# Patient Record
Sex: Female | Born: 1955
Health system: Southern US, Community
[De-identification: ages and names within clinical notes are randomized; demographics above are authoritative.]

## PROBLEM LIST (undated history)

## (undated) DIAGNOSIS — E079 Disorder of thyroid, unspecified: Secondary | ICD-10-CM

## (undated) HISTORY — DX: Other disorders of bilirubin metabolism: E80.6

## (undated) HISTORY — PX: NO PAST SURGERIES: SHX2092

## (undated) HISTORY — DX: Disorder of thyroid, unspecified: E07.9

---

## 2013-07-09 ENCOUNTER — Encounter: Payer: Self-pay | Admitting: Internal Medicine

## 2014-01-02 ENCOUNTER — Ambulatory Visit (INDEPENDENT_AMBULATORY_CARE_PROVIDER_SITE_OTHER): Payer: Managed Care, Other (non HMO) | Admitting: Family Medicine

## 2014-01-02 VITALS — BP 102/70 | HR 84 | Temp 98.4°F | Resp 18 | Ht 66.0 in | Wt 112.0 lb

## 2014-01-02 DIAGNOSIS — E039 Hypothyroidism, unspecified: Secondary | ICD-10-CM

## 2014-01-02 DIAGNOSIS — E038 Other specified hypothyroidism: Secondary | ICD-10-CM | POA: Insufficient documentation

## 2014-01-02 DIAGNOSIS — R350 Frequency of micturition: Secondary | ICD-10-CM

## 2014-01-02 LAB — POCT CBC
GRANULOCYTE PERCENT: 60.6 % (ref 37–80)
HEMATOCRIT: 44.6 % (ref 37.7–47.9)
HEMOGLOBIN: 14.4 g/dL (ref 12.2–16.2)
Lymph, poc: 1.9 (ref 0.6–3.4)
MCH, POC: 30.8 pg (ref 27–31.2)
MCHC: 32.3 g/dL (ref 31.8–35.4)
MCV: 95.5 fL (ref 80–97)
MID (cbc): 0.5 (ref 0–0.9)
MPV: 8.5 fL (ref 0–99.8)
POC Granulocyte: 3.6 (ref 2–6.9)
POC LYMPH PERCENT: 31.5 %L (ref 10–50)
POC MID %: 7.9 %M (ref 0–12)
Platelet Count, POC: 305 10*3/uL (ref 142–424)
RBC: 4.67 M/uL (ref 4.04–5.48)
RDW, POC: 13.3 %
WBC: 5.9 10*3/uL (ref 4.6–10.2)

## 2014-01-02 LAB — COMPREHENSIVE METABOLIC PANEL
ALBUMIN: 4.6 g/dL (ref 3.5–5.2)
ALT: 25 U/L (ref 0–35)
AST: 26 U/L (ref 0–37)
Alkaline Phosphatase: 136 U/L — ABNORMAL HIGH (ref 39–117)
BUN: 17 mg/dL (ref 6–23)
CALCIUM: 9.6 mg/dL (ref 8.4–10.5)
CHLORIDE: 102 meq/L (ref 96–112)
CO2: 30 meq/L (ref 19–32)
Creat: 0.55 mg/dL (ref 0.50–1.10)
GLUCOSE: 93 mg/dL (ref 70–99)
POTASSIUM: 3.9 meq/L (ref 3.5–5.3)
Sodium: 139 mEq/L (ref 135–145)
Total Bilirubin: 1.3 mg/dL — ABNORMAL HIGH (ref 0.3–1.2)
Total Protein: 7.5 g/dL (ref 6.0–8.3)

## 2014-01-02 LAB — POCT UA - MICROSCOPIC ONLY
Amorphous: POSITIVE
Casts, Ur, LPF, POC: NEGATIVE
Crystals, Ur, HPF, POC: NEGATIVE
MUCUS UA: NEGATIVE
RBC, urine, microscopic: NEGATIVE
Renal tubular cells: POSITIVE
Yeast, UA: NEGATIVE

## 2014-01-02 LAB — POCT URINALYSIS DIPSTICK
Bilirubin, UA: NEGATIVE
Blood, UA: NEGATIVE
Glucose, UA: NEGATIVE
KETONES UA: NEGATIVE
Nitrite, UA: NEGATIVE
Protein, UA: NEGATIVE
SPEC GRAV UA: 1.015
Urobilinogen, UA: 0.2
pH, UA: 7.5

## 2014-01-02 NOTE — Patient Instructions (Signed)
I will be in touch with you regarding your labs.  In the meantime just drink plenty of water.  Let me know if you have any problems in the meantime.

## 2014-01-02 NOTE — Progress Notes (Signed)
[facility] [address] [place] [address] [phone]  Date:  01/02/2014   Name:  Renee Watson   DOB:  25-Aug-1956   MRN:  [id number]  PCP:  No primary provider on file.    Chief Complaint: Hypothyroidism and Urinary Tract Infection   History of Present Illness:  Renee Watson is a 58 y.o. very pleasant female patient who presents with the following:  She is here as a new patient today.  She needs her TSH checked for her hypothyroidism.  Her last level was checked about one year ago. However, she now feels like her level might be too low.  She feels that her body is not quite where it should be.  She has not gained weight.  She has not felt very depressed- maybe a little bit blue and anxious lately.  However she just suspects that her level may not be right.   She was dx with hypothyroidism in 2010.   She is a little concerned because her chl went up some at a recent work health screening. She was afraid this might be due to   She also thinks she may have a UTI.  Over thanksgiving she had a UTI.  She used the "tele- doc" service at her work and had a phone consultation.  She was treated with bactrim for 3 days.  She felt better, but her sx never got 100% better.  She tried an OTC UTI diagnostic kit which was positive.  Called the "teledoc" back and was given cipro.  She then felt better- got back to normal. However she now has felt like she might have a UTI again for the last month or so; it may come and go.  She used another azo test strip today and it was "positive and negative."  (tests for leukocytes and nitrites) She has not noted any foul smell or hematuria.   No vaginal sx.   No nausea, vomiting, or fever.  She just feels a "vague uncomfortable feeling" and the need to urinate frequently.  She notes this feeling of discomfort in her lower abdomen.    Patient Active Problem List   Diagnosis Date Noted  . Unspecified hypothyroidism 01/02/2014     Past Medical History  Diagnosis Date  . Thyroid disease     History reviewed. No pertinent past surgical history.  History  Substance Use Topics  . Smoking status: Never Smoker   . Smokeless tobacco: Not on file  . Alcohol Use: Yes    Family History  Problem Relation Age of Onset  . Hyperlipidemia Mother     No Known Allergies  Medication list has been reviewed and updated.  No current outpatient prescriptions on file prior to visit.   No current facility-administered medications on file prior to visit.    Review of Systems:  As per HPI- otherwise negative.   Physical Examination: Filed Vitals:   01/02/14 1428  BP: 102/70  Pulse: 84  Temp: 98.4 F (36.9 C)  Resp: 18   Filed Vitals:   01/02/14 1428  Height: 5' 6"  (1.676 m)  Weight: 112 lb (50.803 kg)   Body mass index is 18.09 kg/(m^2). Ideal Body Weight: Weight in (lb) to have BMI = 25: 154.6  GEN: WDWN, NAD, Non-toxic, A & O x 3, looks well, think build.  Somewhat anxious HEENT: Atraumatic, Normocephalic. Neck supple. No masses, No LAD. Ears and Nose: No external deformity. CV: RRR, No M/G/R. No JVD.  No thrill. No extra heart sounds. PULM: CTA B, no wheezes, crackles, rhonchi. No retractions. No resp. distress. No accessory muscle use. ABD: S, NT, ND, +BS. No rebound. No HSM.  Abdomen is benign EXTR: No c/c/e NEURO Normal gait.  PSYCH: Normally interactive. Conversant.   Results for orders placed in visit on 01/02/14  POCT URINALYSIS DIPSTICK      Result Value Range   Color, UA lt yellow     Clarity, UA clear     Glucose, UA neg     Bilirubin, UA neg     Ketones, UA neg     Spec Grav, UA 1.015     Blood, UA neg     pH, UA 7.5     Protein, UA neg     Urobilinogen, UA 0.2     Nitrite, UA neg     Leukocytes, UA small (1+)    POCT UA - MICROSCOPIC ONLY      Result Value Range   WBC, Ur, HPF, POC 1-4     RBC, urine, microscopic neg     Bacteria, U Microscopic 1+     Mucus, UA neg      Epithelial cells, urine per micros 0-2     Crystals, Ur, HPF, POC neg     Casts, Ur, LPF, POC neg     Yeast, UA neg     Renal tubular cells pos     Amorphous pos    POCT CBC      Result Value Range   WBC 5.9  4.6 - 10.2 K/uL   Lymph, poc 1.9  0.6 - 3.4   POC LYMPH PERCENT 31.5  10 - 50 %L   MID (cbc) 0.5  0 - 0.9   POC MID % 7.9  0 - 12 %M   POC Granulocyte 3.6  2 - 6.9   Granulocyte percent 60.6  37 - 80 %G   RBC 4.67  4.04 - 5.48 M/uL   Hemoglobin 14.4  12.2 - 16.2 g/dL   HCT, POC 44.6  37.7 - 47.9 %   MCV 95.5  80 - 97 fL   MCH, POC 30.8  27 - 31.2 pg   MCHC 32.3  31.8 - 35.4 g/dL   RDW, POC 13.3     Platelet Count, POC 305  142 - 424 K/uL   MPV 8.5  0 - 99.8 fL    Assessment and Plan: Frequent urination - Plan: POCT urinalysis dipstick, POCT UA - Microscopic Only, Urine culture, POCT CBC, Comprehensive metabolic panel  Unspecified hypothyroidism - Plan: TSH  Check TSH today as she is concerned about her thyroid. Will adjust her dosage as needed pending her results.   Vague lower abdominal discomfort. She denies any vaignal dx or dryness.  If urine culture negative will plan on ultrasound of her pelvis.  For now will defer further abx pending her culture results.    Signed [name], MD

## 2014-01-03 LAB — TSH: TSH: 2.094 u[IU]/mL (ref 0.350–4.500)

## 2014-01-03 LAB — URINE CULTURE

## 2014-01-04 MED ORDER — LEVOTHYROXINE SODIUM 50 MCG PO TABS: 50.0000 ug | ORAL_TABLET | Freq: Every day | ORAL | Status: DC

## 2014-01-04 NOTE — Addendum Note (Signed)
Addended by: [name] C on: 01/04/2014 11:16 AM   Modules accepted: Orders

## 2014-01-06 ENCOUNTER — Encounter: Payer: Self-pay | Admitting: Family Medicine

## 2014-01-06 NOTE — Addendum Note (Signed)
Addended by: Jethro Bolus A on: 01/06/2014 11:41 AM   Modules accepted: Orders

## 2014-01-09 ENCOUNTER — Other Ambulatory Visit: Payer: Self-pay | Admitting: Family Medicine

## 2014-01-09 ENCOUNTER — Ambulatory Visit
Admission: RE | Admit: 2014-01-09 | Discharge: 2014-01-09 | Disposition: A | Discharge status: Home / Self Care | Payer: 59 | Source: Ambulatory Visit | Attending: Family Medicine | Admitting: Family Medicine

## 2014-01-09 DIAGNOSIS — R350 Frequency of micturition: Secondary | ICD-10-CM

## 2014-01-16 ENCOUNTER — Encounter: Payer: Self-pay | Admitting: Family Medicine

## 2014-01-16 DIAGNOSIS — R945 Abnormal results of liver function studies: Secondary | ICD-10-CM

## 2014-12-15 ENCOUNTER — Other Ambulatory Visit: Payer: Self-pay | Admitting: Family Medicine

## 2014-12-15 DIAGNOSIS — Z1329 Encounter for screening for other suspected endocrine disorder: Secondary | ICD-10-CM

## 2015-01-08 ENCOUNTER — Other Ambulatory Visit: Payer: Self-pay | Admitting: Family Medicine

## 2015-01-08 ENCOUNTER — Ambulatory Visit (INDEPENDENT_AMBULATORY_CARE_PROVIDER_SITE_OTHER): Payer: 59 | Admitting: Family Medicine

## 2015-01-08 VITALS — BP 112/64 | HR 75 | Temp 98.5°F | Resp 16 | Ht 66.0 in | Wt 111.0 lb

## 2015-01-08 DIAGNOSIS — Z23 Encounter for immunization: Secondary | ICD-10-CM

## 2015-01-08 DIAGNOSIS — R748 Abnormal levels of other serum enzymes: Secondary | ICD-10-CM

## 2015-01-08 DIAGNOSIS — Z780 Asymptomatic menopausal state: Secondary | ICD-10-CM

## 2015-01-08 DIAGNOSIS — R636 Underweight: Secondary | ICD-10-CM

## 2015-01-08 DIAGNOSIS — E038 Other specified hypothyroidism: Secondary | ICD-10-CM

## 2015-01-08 LAB — COMPREHENSIVE METABOLIC PANEL
ALT: 29 U/L (ref 0–35)
AST: 30 U/L (ref 0–37)
Albumin: 4.3 g/dL (ref 3.5–5.2)
Alkaline Phosphatase: 163 U/L — ABNORMAL HIGH (ref 39–117)
BUN: 14 mg/dL (ref 6–23)
CALCIUM: 9.5 mg/dL (ref 8.4–10.5)
CO2: 28 mEq/L (ref 19–32)
Chloride: 103 mEq/L (ref 96–112)
Creat: 0.56 mg/dL (ref 0.50–1.10)
Glucose, Bld: 79 mg/dL (ref 70–99)
Potassium: 4.4 mEq/L (ref 3.5–5.3)
SODIUM: 137 meq/L (ref 135–145)
Total Bilirubin: 1.6 mg/dL — ABNORMAL HIGH (ref 0.2–1.2)
Total Protein: 7.2 g/dL (ref 6.0–8.3)

## 2015-01-08 LAB — TSH: TSH: 1.899 u[IU]/mL (ref 0.350–4.500)

## 2015-01-08 MED ORDER — LEVOTHYROXINE SODIUM 50 MCG PO TABS: 50.0000 ug | ORAL_TABLET | Freq: Every day | ORAL | Status: DC

## 2015-01-08 NOTE — Progress Notes (Addendum)
[facility] [address], [place] [address] [phone]  Date:  01/08/2015   Name:  Renee Watson   DOB:  [birth year]   MRN:  [id number]  PCP:  No primary care provider on file.    Chief Complaint: Follow-up   History of Present Illness:  Renee Watson is a 59 y.o. very pleasant female patient who presents with the following:  Here today to follow-up labs.  Last seen by myself about a year ago.  She needs a follow- up TSH today and also a CMP to follow-up her minimally elevated alk phos and bilirubin in the past She needs a RF of her thyroid medication She would like a vitamin D level.  She has had a bone density scan a while ago- she thinks it was normal but is really not sure how long ago this was.  She is quite slim and does not get a lot of sun exposure due to history of skin cancer She enjoys walking for exercise She would like to have a tetnus shot today- she is overdue but unsure of the date of her last sthot  Patient Active Problem List   Diagnosis Date Noted  . Unspecified hypothyroidism 01/02/2014    Past Medical History  Diagnosis Date  . Thyroid disease     History reviewed. No pertinent past surgical history.  History  Substance Use Topics  . Smoking status: Never Smoker   . Smokeless tobacco: Not on file  . Alcohol Use: Yes    Family History  Problem Relation Age of Onset  . Hyperlipidemia Mother     No Known Allergies  Medication list has been reviewed and updated.  Current Outpatient Prescriptions on File Prior to Visit  Medication Sig Dispense Refill  . levothyroxine (SYNTHROID, LEVOTHROID) 50 MCG tablet Take 1 tablet (50 mcg total) by mouth daily before breakfast. 90 tablet 3   No current facility-administered medications on file prior to visit.    Review of Systems:  As per HPI- otherwise negative.   Physical Examination: Filed Vitals:   01/08/15 1017  BP: 112/64  Pulse: 75  Temp: 98.5 F (36.9 C)   Resp: 16   Filed Vitals:   01/08/15 1017  Height: 5' 6"  (1.676 m)  Weight: 111 lb (50.349 kg)   Body mass index is 17.92 kg/(m^2). Ideal Body Weight: Weight in (lb) to have BMI = 25: 154.6  GEN: WDWN, NAD, Non-toxic, A & O x 3, very slim, looks well HEENT: Atraumatic, Normocephalic. Neck supple. No masses, No LAD. Ears and Nose: No external deformity. CV: RRR, No M/G/R. No JVD. No thrill. No extra heart sounds. PULM: CTA B, no wheezes, crackles, rhonchi. No retractions. No resp. distress. No accessory muscle use. ABD: S, NT, ND. No rebound. No HSM. EXTR: No c/c/e NEURO Normal gait.  PSYCH: Normally interactive. Conversant. Not depressed or anxious appearing.  Calm demeanor.   Wt Readings from Last 3 Encounters:  01/08/15 111 lb (50.349 kg)  01/02/14 112 lb (50.803 kg)   Assessment and Plan: Other specified hypothyroidism - Plan: levothyroxine (SYNTHROID, LEVOTHROID) 50 MCG tablet, TSH  Postmenopausal - Plan: Vitamin D, 25-hydroxy  Abnormal alkaline phosphatase test - Plan: Comprehensive metabolic panel  Immunization due - Plan: Tdap vaccine greater than or equal to 7yo IM  Counseled her that her BMI is underweight,  Encouraged her to gain a few lbs and she will try Check labs as above Update tdap, flu shot done already  Encouraged a mammogram/ bone density but she wants to defer due to recent expenditures   Will plan further follow- up pending labs.   Signed [name], MD  addnd 1/16  Results for orders placed or performed in visit on 01/08/15  Comprehensive metabolic panel  Result Value Ref Range   Sodium 137 135 - 145 mEq/L   Potassium 4.4 3.5 - 5.3 mEq/L   Chloride 103 96 - 112 mEq/L   CO2 28 19 - 32 mEq/L   Glucose, Bld 79 70 - 99 mg/dL   BUN 14 6 - 23 mg/dL   Creat 0.56 0.50 - 1.10 mg/dL   Total Bilirubin 1.6 (H) 0.2 - 1.2 mg/dL   Alkaline Phosphatase 163 (H) 39 - 117 U/L   AST 30 0 - 37 U/L   ALT 29 0 - 35 U/L   Total Protein 7.2 6.0 - 8.3 g/dL    Albumin 4.3 3.5 - 5.2 g/dL   Calcium 9.5 8.4 - 10.5 mg/dL  TSH  Result Value Ref Range   TSH 1.899 0.350 - 4.500 uIU/mL  Vitamin D, 25-hydroxy  Result Value Ref Range   Vit D, 25-Hydroxy 54 30 - 100 ng/mL   Alk phos and also GGT, bili elevated.  Need to do further evaluation into origin of likely cholestasis.  Will order RUQ [place] and proceed pending results

## 2015-01-08 NOTE — Patient Instructions (Addendum)
I will be in touch with your labs- I'll send your TSH level to you on mychart.  Please wait to fill your thyroid medication until I send this to you Work on gaining a little weight- may want to add in a couple of snacks daily.   Please think about getting a mammogram and bone density scan sometime this year.    You got your tdap shot today (tetanus plus pertussis booster)

## 2015-01-09 LAB — HEPATITIS PANEL, ACUTE
HCV Ab: NEGATIVE
HEP B S AG: NEGATIVE
Hep A IgM: NONREACTIVE
Hep B C IgM: NONREACTIVE

## 2015-01-09 LAB — BILIRUBIN,DIRECT & INDIRECT (FRACTIONATED)
Bilirubin, Direct: 0.3 mg/dL (ref 0.0–0.3)
Indirect Bilirubin: 1.3 mg/dL — ABNORMAL HIGH (ref 0.2–1.2)

## 2015-01-09 LAB — GAMMA GT: GGT: 204 U/L — ABNORMAL HIGH (ref 7–51)

## 2015-01-09 LAB — VITAMIN D 25 HYDROXY (VIT D DEFICIENCY, FRACTURES): Vit D, 25-Hydroxy: 54 ng/mL (ref 30–100)

## 2015-01-10 ENCOUNTER — Encounter: Payer: Self-pay | Admitting: Family Medicine

## 2015-01-10 DIAGNOSIS — R636 Underweight: Secondary | ICD-10-CM | POA: Insufficient documentation

## 2015-01-10 NOTE — Addendum Note (Signed)
Addended by: [name] C on: 01/10/2015 07:05 AM   Modules accepted: Orders

## 2015-01-15 ENCOUNTER — Ambulatory Visit
Admission: RE | Admit: 2015-01-15 | Discharge: 2015-01-15 | Disposition: A | Discharge status: Home / Self Care | Payer: 59 | Source: Ambulatory Visit | Attending: Family Medicine | Admitting: Family Medicine

## 2015-01-15 ENCOUNTER — Other Ambulatory Visit: Payer: Self-pay | Admitting: Family Medicine

## 2015-01-15 DIAGNOSIS — R748 Abnormal levels of other serum enzymes: Secondary | ICD-10-CM

## 2015-01-16 ENCOUNTER — Other Ambulatory Visit: Payer: 59

## 2015-01-16 ENCOUNTER — Encounter: Payer: Self-pay | Admitting: Family Medicine

## 2015-01-19 ENCOUNTER — Encounter: Payer: Self-pay | Admitting: Internal Medicine

## 2015-02-04 ENCOUNTER — Other Ambulatory Visit (INDEPENDENT_AMBULATORY_CARE_PROVIDER_SITE_OTHER): Payer: 59

## 2015-02-04 ENCOUNTER — Encounter: Payer: Self-pay | Admitting: Internal Medicine

## 2015-02-04 ENCOUNTER — Ambulatory Visit (INDEPENDENT_AMBULATORY_CARE_PROVIDER_SITE_OTHER): Payer: 59 | Admitting: Internal Medicine

## 2015-02-04 VITALS — BP 102/68 | HR 92 | Ht 66.0 in | Wt 109.0 lb

## 2015-02-04 DIAGNOSIS — R17 Unspecified jaundice: Secondary | ICD-10-CM

## 2015-02-04 DIAGNOSIS — R748 Abnormal levels of other serum enzymes: Secondary | ICD-10-CM

## 2015-02-04 DIAGNOSIS — Z1211 Encounter for screening for malignant neoplasm of colon: Secondary | ICD-10-CM

## 2015-02-04 LAB — CBC WITH DIFFERENTIAL/PLATELET
BASOS ABS: 0 10*3/uL (ref 0.0–0.1)
Basophils Relative: 0.8 % (ref 0.0–3.0)
EOS ABS: 0.1 10*3/uL (ref 0.0–0.7)
Eosinophils Relative: 1.7 % (ref 0.0–5.0)
HEMATOCRIT: 42.3 % (ref 36.0–46.0)
Hemoglobin: 14.6 g/dL (ref 12.0–15.0)
LYMPHS PCT: 26.1 % (ref 12.0–46.0)
Lymphs Abs: 1.3 10*3/uL (ref 0.7–4.0)
MCHC: 34.5 g/dL (ref 30.0–36.0)
MCV: 89 fl (ref 78.0–100.0)
Monocytes Absolute: 0.5 10*3/uL (ref 0.1–1.0)
Monocytes Relative: 9 % (ref 3.0–12.0)
NEUTROS ABS: 3.2 10*3/uL (ref 1.4–7.7)
Neutrophils Relative %: 62.4 % (ref 43.0–77.0)
Platelets: 276 10*3/uL (ref 150.0–400.0)
RBC: 4.76 Mil/uL (ref 3.87–5.11)
RDW: 12.7 % (ref 11.5–15.5)
WBC: 5.1 10*3/uL (ref 4.0–10.5)

## 2015-02-04 LAB — PROTIME-INR
INR: 1 ratio (ref 0.8–1.0)
Prothrombin Time: 11.5 s (ref 9.6–13.1)

## 2015-02-04 NOTE — Patient Instructions (Signed)
Your physician has requested that you go to the basement for lab work before leaving today  You have been given some information on colonoscopies  Please follow up with Dr. Henrene Pastor on 03/03/2015 at 1:30pm.

## 2015-02-04 NOTE — Progress Notes (Signed)
HISTORY OF PRESENT ILLNESS:  Renee Watson is a 59 y.o. female with a history of hypothyroidism who is referred today regarding persistent elevation of alkaline phosphatase and bilirubin. The patient denies any personal history of liver disease. Her father apparently had alcoholic cirrhosis. The patient herself has limited alcohol use (1-2 glasses of wine on weekends only). Last year she was noted to have mild elevation of alkaline phosphatase and bilirubin. One month ago the same with alkaline phosphatase 163 and total bilirubin 1.6. The majority of the bilirubin elevation was in direct equaling 1.3. The transaminases were normal. Mild elevation of GGT. Normal vitamin D level and TSH. Patient has been reading about different liver conditions online and is quite concerned/anxious. She denies fatigue or itching. Her weight has been stable. An abdominal ultrasound was performed 01/15/2015. The liver was normal. She has not had screening colonoscopy, though reports negative Hemoccult studies.  REVIEW OF SYSTEMS:  All non-GI ROS negative upon comprehensive review  Past Medical History  Diagnosis Date  . Thyroid disease   . Hyperbilirubinemia     History reviewed. No pertinent past surgical history.  Social History Renee Watson  reports that she has never smoked. She has never used smokeless tobacco. She reports that she drinks alcohol. She reports that she does not use illicit drugs.  family history includes Diabetes in her mother; Hyperlipidemia in her mother.  No Known Allergies     PHYSICAL EXAMINATION: Vital signs: BP 102/68 mmHg  Pulse 92  Ht 5\' 6"  (1.676 m)  Wt 109 lb (49.442 kg)  BMI 17.60 kg/m2  Constitutional: generally well-appearing, no acute distress Psychiatric: alert and oriented x3, cooperative Eyes: extraocular movements intact, anicteric, conjunctiva pink Mouth: oral pharynx moist, no lesions Neck: supple no lymphadenopathy Cardiovascular: heart regular rate  and rhythm, no murmur Lungs: clear to auscultation bilaterally Abdomen: soft, nontender, nondistended, no obvious ascites, no peritoneal signs, normal bowel sounds, no organomegaly Rectal: Omitted Extremities: no lower extremity edema bilaterally Skin: no lesions on visible extremities Neuro: No focal deficits. No asterixis.    ASSESSMENT:  #1. Elevation of alkaline phosphatase. Asymptomatic without obvious evidence for liver disease. May represent primary biliary cirrhosis #2. Elevated bilirubin. Principally indirect. Suspect Gilbert's syndrome #3. Colon cancer screening. Baseline risk. We discussed screening colonoscopy   PLAN:  #1. CBC, PT/INR, antimitochondrial antibody, fractionated alkaline phosphatase #2. Literature on colon cancer and colon polyps as well as colonoscopy provided for patient review. Discuss at next visit #3. Office visit in a few weeks.

## 2015-02-06 ENCOUNTER — Telehealth: Payer: Self-pay | Admitting: Internal Medicine

## 2015-02-06 NOTE — Telephone Encounter (Signed)
Patient notified that part of labs are still pending.

## 2015-02-09 LAB — ALKALINE PHOSPHATASE ISOENZYMES
Alkaline Phonsphatase: 231 U/L — ABNORMAL HIGH (ref 33–130)
Bone Isoenzymes: 9 % — ABNORMAL LOW (ref 28–66)
Intestinal Isoenzymes: 0 % — ABNORMAL LOW (ref 1–24)
LIVER ISOENZYMES (ALP ISO): 70 % — AB (ref 25–69)
MACROHEPATIC ISOENZYMES: 21 % — AB

## 2015-02-13 ENCOUNTER — Telehealth: Payer: Self-pay | Admitting: Internal Medicine

## 2015-02-17 ENCOUNTER — Other Ambulatory Visit: Payer: 59

## 2015-02-17 ENCOUNTER — Other Ambulatory Visit: Payer: Self-pay

## 2015-02-17 DIAGNOSIS — R7989 Other specified abnormal findings of blood chemistry: Secondary | ICD-10-CM

## 2015-02-17 DIAGNOSIS — R945 Abnormal results of liver function studies: Principal | ICD-10-CM

## 2015-02-17 NOTE — Telephone Encounter (Signed)
Discussed with pt that the antimitochondrial antibodies needs to be redrawn. Pt will come to have redrawn.

## 2015-02-18 LAB — MITOCHONDRIAL ANTIBODIES: Mitochondrial M2 Ab, IgG: 0.33 (ref <0.91)

## 2015-02-25 ENCOUNTER — Other Ambulatory Visit: Payer: Self-pay

## 2015-02-25 DIAGNOSIS — R748 Abnormal levels of other serum enzymes: Secondary | ICD-10-CM

## 2015-02-26 ENCOUNTER — Telehealth: Payer: Self-pay | Admitting: Internal Medicine

## 2015-02-26 NOTE — Telephone Encounter (Signed)
Spoke with pt and she wanted more information as to why the MRI/MRCP was being done. Discussed this with pt and questions answered.

## 2015-02-27 ENCOUNTER — Ambulatory Visit (HOSPITAL_COMMUNITY)
Admission: RE | Admit: 2015-02-27 | Discharge: 2015-02-27 | Disposition: A | Discharge status: Home / Self Care | Payer: 59 | Source: Ambulatory Visit | Attending: Internal Medicine | Admitting: Internal Medicine

## 2015-02-27 ENCOUNTER — Other Ambulatory Visit: Payer: Self-pay | Admitting: Internal Medicine

## 2015-02-27 DIAGNOSIS — R748 Abnormal levels of other serum enzymes: Secondary | ICD-10-CM | POA: Diagnosis not present

## 2015-02-27 DIAGNOSIS — K769 Liver disease, unspecified: Secondary | ICD-10-CM | POA: Insufficient documentation

## 2015-02-27 MED ORDER — GADOBENATE DIMEGLUMINE 529 MG/ML IV SOLN
10.0000 mL | Freq: Once | INTRAVENOUS | Status: AC | PRN
Start: 2015-02-27 — End: 2015-02-27
  Administered 2015-02-27: 10 mL via INTRAVENOUS

## 2015-03-03 ENCOUNTER — Encounter: Payer: Self-pay | Admitting: Family Medicine

## 2015-03-03 ENCOUNTER — Ambulatory Visit (INDEPENDENT_AMBULATORY_CARE_PROVIDER_SITE_OTHER): Payer: 59 | Admitting: Internal Medicine

## 2015-03-03 ENCOUNTER — Encounter: Payer: Self-pay | Admitting: Internal Medicine

## 2015-03-03 VITALS — BP 100/64 | HR 60 | Ht 66.0 in | Wt 110.6 lb

## 2015-03-03 DIAGNOSIS — R748 Abnormal levels of other serum enzymes: Secondary | ICD-10-CM | POA: Insufficient documentation

## 2015-03-03 DIAGNOSIS — R945 Abnormal results of liver function studies: Secondary | ICD-10-CM

## 2015-03-03 DIAGNOSIS — R17 Unspecified jaundice: Secondary | ICD-10-CM

## 2015-03-03 DIAGNOSIS — Z1211 Encounter for screening for malignant neoplasm of colon: Secondary | ICD-10-CM

## 2015-03-03 DIAGNOSIS — R7989 Other specified abnormal findings of blood chemistry: Secondary | ICD-10-CM

## 2015-03-03 NOTE — Progress Notes (Signed)
HISTORY OF PRESENT ILLNESS:  Renee Watson is a 59 y.o. female who was evaluated 02/04/2015 for mild elevation of alkaline phosphatase. Also, mild elevation of bilirubin, mostly unconjugated consistent with Gilbert's. She was asymptomatic without obvious liver disease. Prior abdominal ultrasound was negative. Alkaline phosphatase was fractionated and consistent with liver origin. CBC with differential was normal as was TSH. Prothrombin time was normal and AMA negative. She was on no medications or supplements other than levothyroid. Healthy without other medical conditions. Subsequently sent for MRI/MRCP. No evidence for infiltrating or space-occupying liver lesions. Small incidental FNH and hemangioma noted. Normal biliary system without changes of sclerosing cholangitis. She presents today for follow-up. Other than being anxious regarding the possible clinical implications of her lab test abnormality, no other issues. I did introduce topic of screening colonoscopy at the time of her last visit  REVIEW OF SYSTEMS:  All non-GI ROS negative upon review  Past Medical History  Diagnosis Date  . Thyroid disease   . Hyperbilirubinemia     History reviewed. No pertinent past surgical history.  Social History Renee Watson  reports that she has never smoked. She has never used smokeless tobacco. She reports that she drinks alcohol. She reports that she does not use illicit drugs.  family history includes Diabetes in her mother; Hyperlipidemia in her mother.  No Known Allergies     PHYSICAL EXAMINATION: Vital signs: BP 100/64 mmHg  Pulse 60  Ht 5\' 6"  (1.676 m)  Wt 110 lb 9.6 oz (50.168 kg)  BMI 17.86 kg/m2 General: Well-developed, well-nourished, no acute distress HEENT: Sclerae are anicteric,  Abdomen: Not reexamined Psychiatric: alert and oriented x3. Cooperative   ASSESSMENT:  #1. Mild elevation of alkaline phosphatase (approximately 1.5 times the upper limit normal). No  obvious etiology despite extensive workup. Asymptomatic. Normal liver function. No confounding variables.  #2. Mild elevation in indirect bilirubin. Suspect Gilbert's #3. Colon cancer screening. Discussed   PLAN:  #1. At this point we discussed further workup, which would be liver biopsy. Discussed rare entities such as vanishing bile duct syndrome. Also discussed observation with close follow-up. She opted for the latter, which I support. #2. Again discussed colon cancer screening and screening colonoscopy. She has reviewed the information and will consider such #3. Routine GI follow-up January 2017. Repeat liver tests at that time. #4. Resume general medical care with Dr. Lorelei Pont   Approximately 50 minutes was spent with this patient reviewing her condition, its workup, and management

## 2015-03-03 NOTE — Patient Instructions (Signed)
Please follow up with Dr. Henrene Pastor in January

## 2015-09-24 ENCOUNTER — Telehealth: Payer: Self-pay | Admitting: Family Medicine

## 2015-09-24 NOTE — Telephone Encounter (Signed)
Left a message for patient to return call to schedule a mammogram. 

## 2015-09-29 ENCOUNTER — Telehealth: Payer: Self-pay | Admitting: Family Medicine

## 2015-09-29 NOTE — Telephone Encounter (Signed)
Left a message for patient to return call to schedule a mammogram. 

## 2016-01-12 ENCOUNTER — Ambulatory Visit (INDEPENDENT_AMBULATORY_CARE_PROVIDER_SITE_OTHER): Payer: 59 | Admitting: Family Medicine

## 2016-01-12 VITALS — BP 112/70 | HR 82 | Temp 98.2°F | Resp 18 | Ht 66.0 in | Wt 111.2 lb

## 2016-01-12 DIAGNOSIS — E038 Other specified hypothyroidism: Secondary | ICD-10-CM | POA: Diagnosis not present

## 2016-01-12 DIAGNOSIS — F4322 Adjustment disorder with anxiety: Secondary | ICD-10-CM

## 2016-01-12 LAB — COMPLETE METABOLIC PANEL WITH GFR
ALT: 25 U/L (ref 6–29)
AST: 24 U/L (ref 10–35)
Albumin: 4.2 g/dL (ref 3.6–5.1)
Alkaline Phosphatase: 194 U/L — ABNORMAL HIGH (ref 33–130)
BUN: 16 mg/dL (ref 7–25)
CO2: 28 mmol/L (ref 20–31)
Calcium: 9.4 mg/dL (ref 8.6–10.4)
Chloride: 102 mmol/L (ref 98–110)
Creat: 0.64 mg/dL (ref 0.50–0.99)
GFR, Est African American: >89 mL/min (ref >=60)
GFR, Est Non African American: >89 mL/min (ref >=60)
Glucose, Bld: 79 mg/dL (ref 65–99)
Potassium: 4.2 mmol/L (ref 3.5–5.3)
Sodium: 141 mmol/L (ref 135–146)
Total Bilirubin: 0.9 mg/dL (ref 0.2–1.2)
Total Protein: 6.6 g/dL (ref 6.1–8.1)

## 2016-01-12 MED ORDER — CITALOPRAM HYDROBROMIDE 10 MG PO TABS: 10.0000 mg | ORAL_TABLET | Freq: Every day | ORAL | Status: DC

## 2016-01-12 MED ORDER — LEVOTHYROXINE SODIUM 50 MCG PO TABS: 50.0000 ug | ORAL_TABLET | Freq: Every day | ORAL | Status: DC

## 2016-01-12 NOTE — Progress Notes (Signed)
Subjective:  This chart was scribed for Robyn Haber MD, by Tamsen Roers, at [facility].  This patient was seen in room 12 and the patient's care was started at 1:36 PM.   Chief Complaint  Patient presents with  . Medication Refill    synthroid 52mcg     Patient ID: Renee Watson, female    DOB: 1956-09-13, 60 y.o.   MRN: [id number]  HPI  HPI Comments: Renee Watson is a 60 y.o. female who presents to the [facility] for a refill for her thyroid medication and blood work.  She is compliant with her thyroid medication and does not eat dairy when she takes it.  She denies any new symptoms but feels like she may be depressed at times. She occasionally feels very anxious/nervous and states that she is very busy at work.  She thinks that her feelings of depression may be due to "getting old" or could be simply due to being run down from a recent cold.  She also states that her mind races at night before bed.  On new years eve, she caught a cold and states that she is just getting over it.   Patient works for an [employer] resources [employer].   Patient Active Problem List   Diagnosis Date Noted  . Elevated alkaline phosphatase level 03/03/2015  . Underweight 01/10/2015  . Unspecified hypothyroidism 01/02/2014   Past Medical History  Diagnosis Date  . Thyroid disease   . Hyperbilirubinemia    History reviewed. No pertinent past surgical history. No Known Allergies Prior to Admission medications   Medication Sig Start Date End Date Taking? Authorizing Provider  levothyroxine (SYNTHROID, LEVOTHROID) 50 MCG tablet Take 1 tablet (50 mcg total) by mouth daily before breakfast. 01/08/15  Yes Darreld Mclean, MD   Social History   Social History  . Marital Status: Divorced    Spouse Name: N/A  . Number of Children: N/A  . Years of Education: N/A   Occupational History  . Not on file.   Social History Main Topics  . Smoking status:  Never Smoker   . Smokeless tobacco: Never Used  . Alcohol Use: 0.0 oz/week    0 Standard drinks or equivalent per week  . Drug Use: No  . Sexual Activity: Not on file   Other Topics Concern  . Not on file   Social History Narrative        Review of Systems  Constitutional: Negative for fever and chills.  Respiratory: Negative for cough and shortness of breath.   Gastrointestinal: Negative for nausea and vomiting.  Musculoskeletal: Negative for neck pain and neck stiffness.  Skin: Negative for color change.  Neurological: Negative for seizures, syncope and speech difficulty.  Psychiatric/Behavioral: The patient is nervous/anxious.        Objective:   Physical Exam  Constitutional: She is oriented to person, place, and time. She appears well-developed and well-nourished. No distress.  HENT:  Head: Normocephalic and atraumatic.  Eyes: Pupils are equal, round, and reactive to light.  Neck: Normal range of motion.  Pulmonary/Chest: Effort normal. No respiratory distress.  Neurological: She is alert and oriented to person, place, and time.  Skin: Skin is warm and dry.   Filed Vitals:   01/12/16 1327  BP: 112/70  Pulse: 82  Temp: 98.2 F (36.8 C)  TempSrc: Oral  Resp: 18  Height: 5\' 6"  (1.676 m)  Weight: 111 lb 3.2 oz (50.44 kg)  SpO2: 98%    patient appears to be somewhat nervous she describes her feelings of mild depression having turned 59 this year.       Assessment & Plan:   This chart was scribed in my presence and reviewed by me personally.    ICD-9-CM ICD-10-CM   1. Other specified hypothyroidism 244.8 E03.8 levothyroxine (SYNTHROID, LEVOTHROID) 50 MCG tablet     Thyroid Panel With TSH  2. Adjustment disorder with anxious mood 309.24 [id number] COMPLETE METABOLIC PANEL WITH GFR     citalopram (CELEXA) 10 MG tablet     Signed, Robyn Haber, MD

## 2016-01-13 LAB — THYROID PANEL WITH TSH
Free Thyroxine Index: 3.1 (ref 1.4–3.8)
T3 Uptake: 35 % (ref 22–35)
T4, Total: 8.8 ug/dL (ref 4.5–12.0)
TSH: 2.364 u[IU]/mL (ref 0.350–4.500)

## 2016-01-15 ENCOUNTER — Encounter: Payer: Self-pay | Admitting: Family Medicine

## 2016-01-20 ENCOUNTER — Encounter: Payer: Self-pay | Admitting: Family Medicine

## 2016-05-06 ENCOUNTER — Telehealth: Payer: Self-pay

## 2016-05-06 NOTE — Telephone Encounter (Signed)
MRI entered in error, pt had scan done and no other radiological exam recommended. Pt did not come for OV in Jan. Letter mailed to pt regarding scheduling an appt.

## 2016-05-06 NOTE — Telephone Encounter (Signed)
-----   Message from Irene Shipper, MD sent at 05/02/2016  4:52 PM EDT ----- Patient overdue for follow up MRI and OV. If she is interested please arrange; if not interested please document for the record and release this overdue result. Thanks  ----- Message -----    From: SYSTEM    Sent: 05/01/2016  12:07 AM      To: Irene Shipper, MD

## 2016-09-04 IMAGING — US US ABDOMEN LIMITED
1 series · 14 of 25 positions shown · non-contrast
Comparison: None.

CLINICAL DATA: Elevated alkaline phosphatase and bilirubin

EXAM:
US ABDOMEN LIMITED - RIGHT UPPER QUADRANT

[Series 1: us abdomen limited · 0.18mm/px · 14 of 47 slices shown]
[im 1/47]
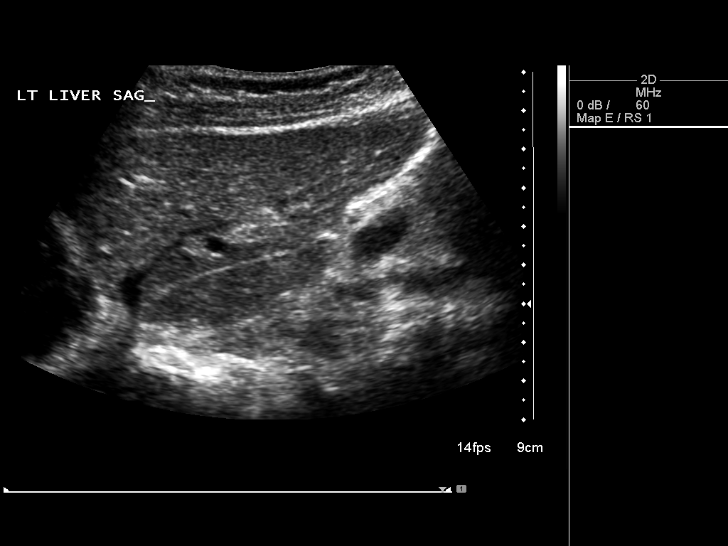
[im 4/47]
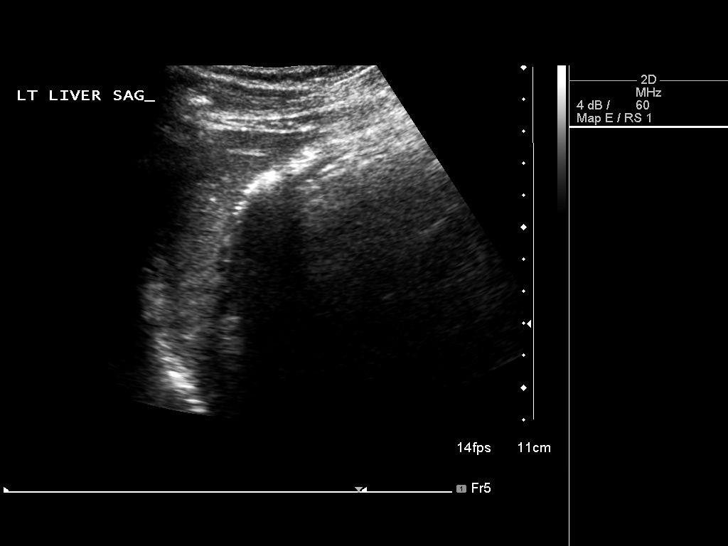
[im 8/47]
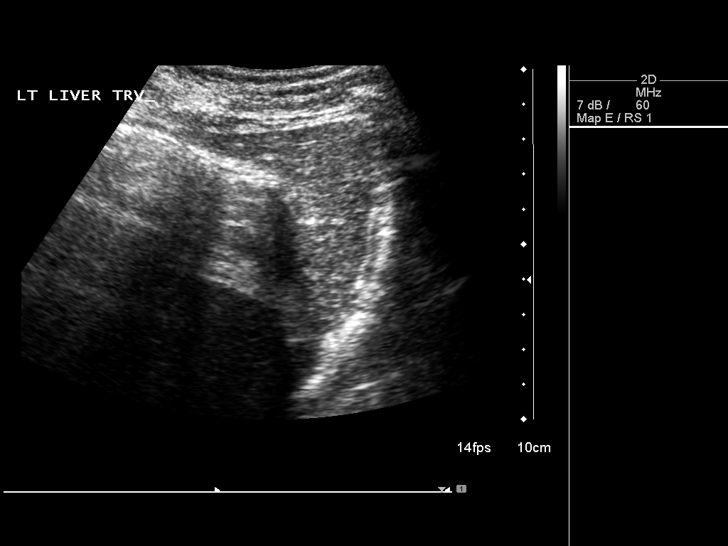
[im 12/47]
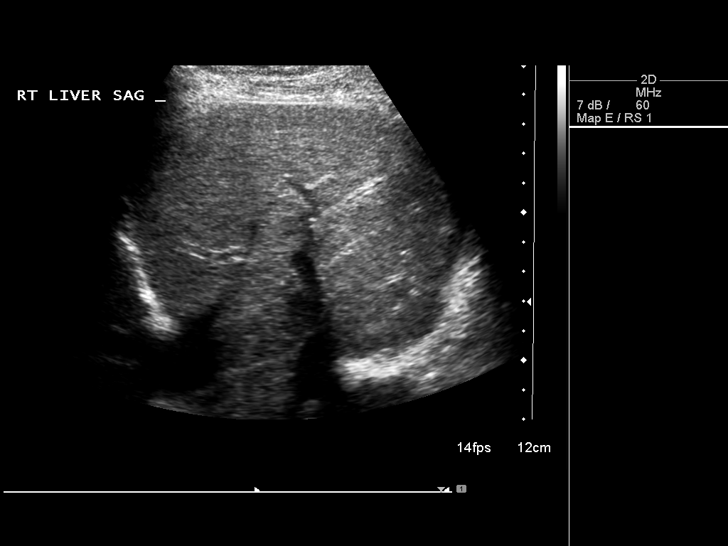
[im 16/47]
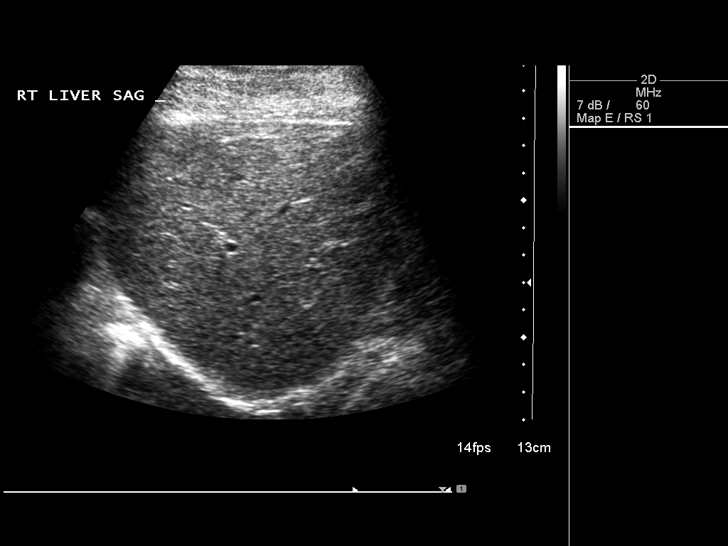
[im 18/47]
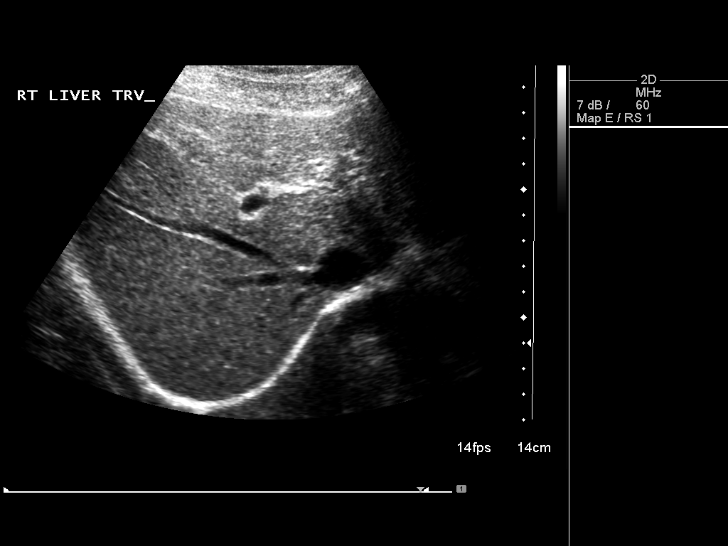
[im 22/47]
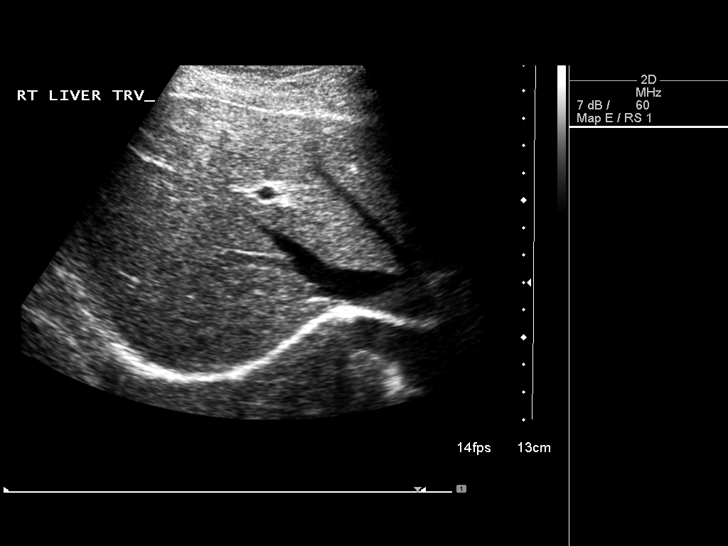
[im 25/47]
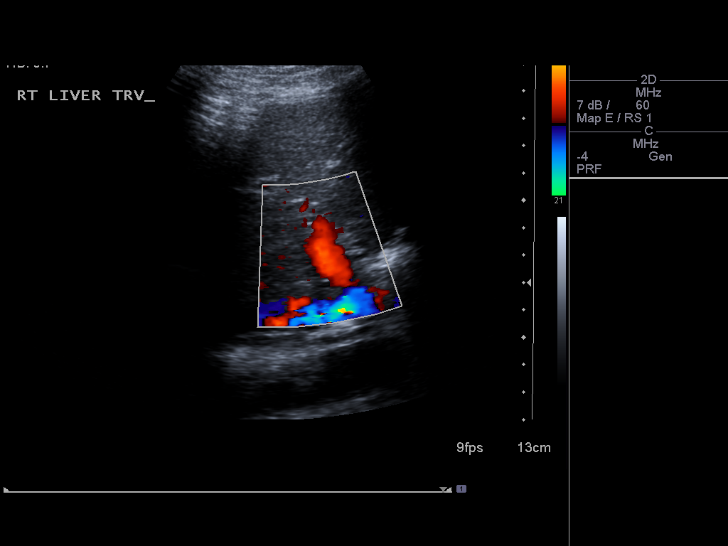
[im 29/47]
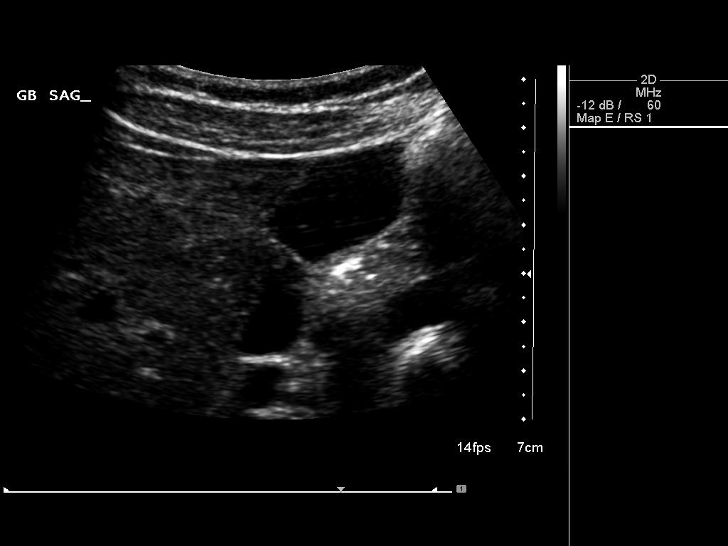
[im 31/47]
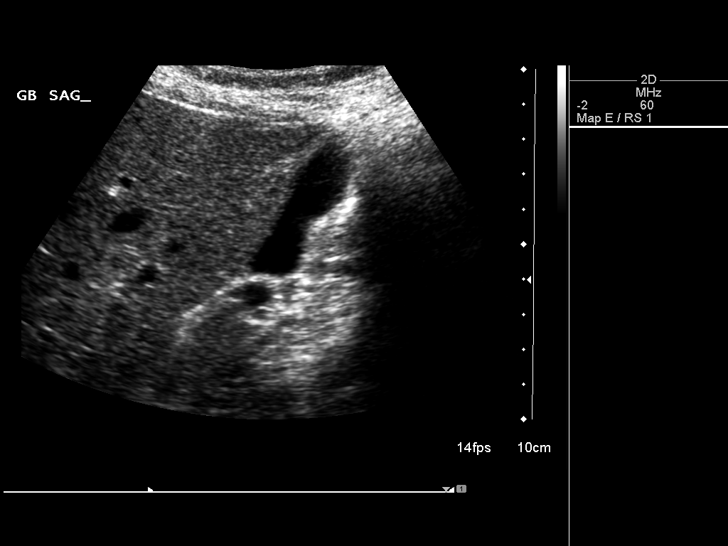
[im 35/47]
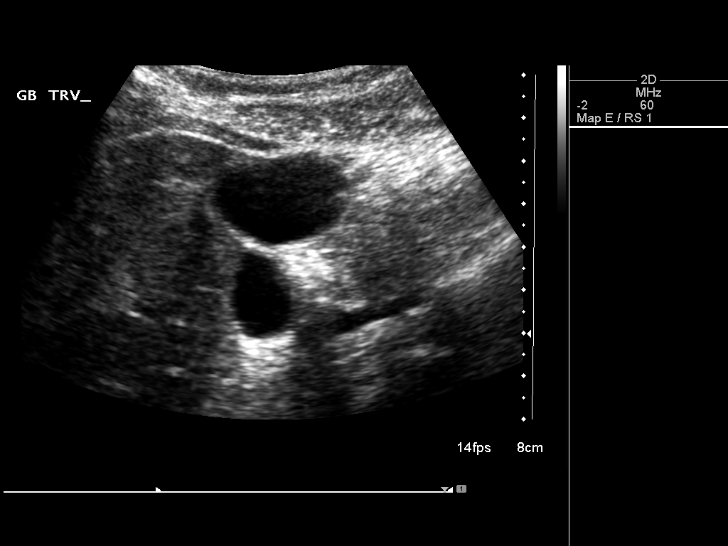
[im 39/47]
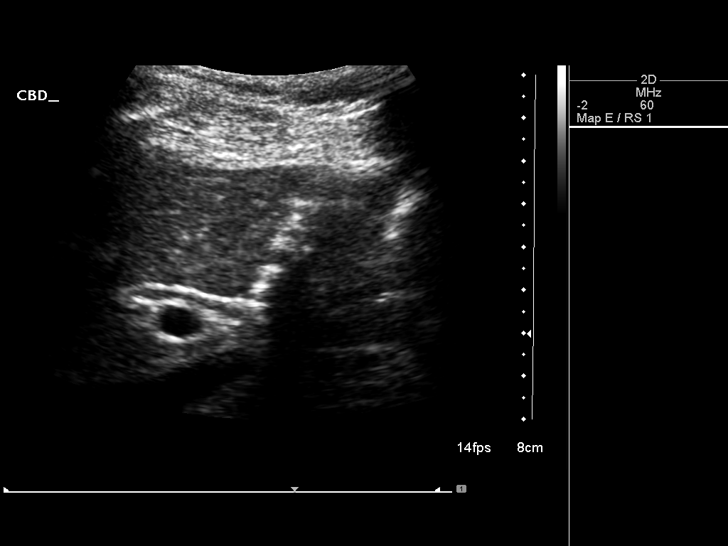
[im 43/47]
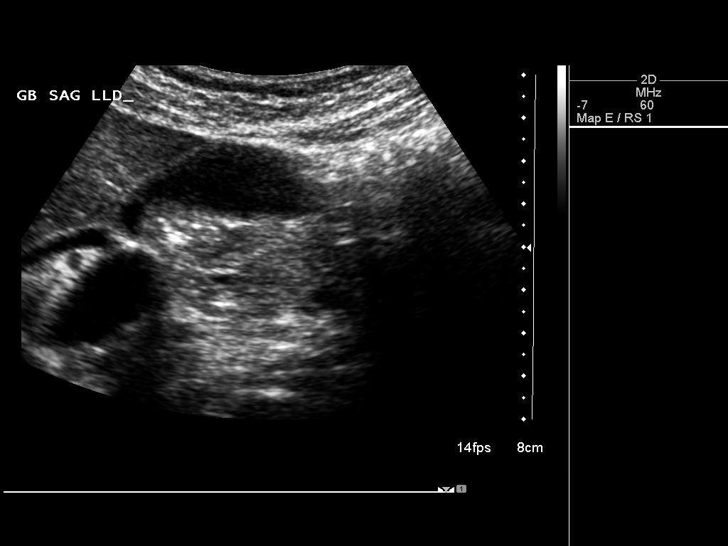
[im 47/47]
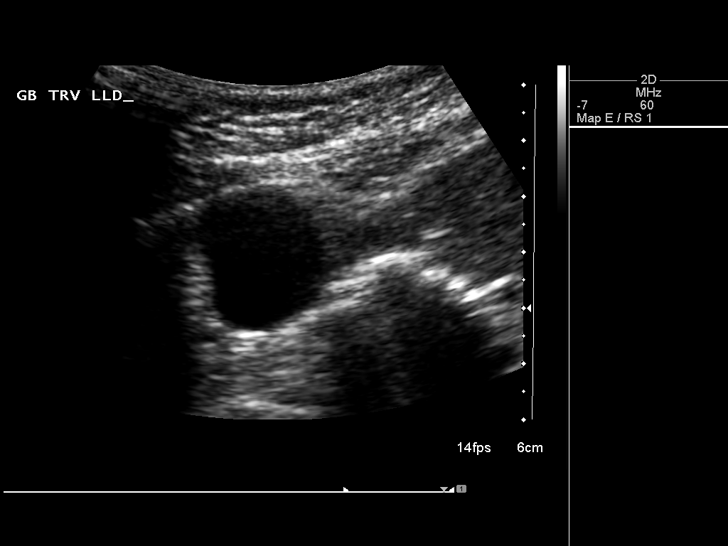

[14 of 25 positions shown; findings below may reference images not displayed]

FINDINGS: Gallbladder:

No gallstones or wall thickening visualized. No sonographic Murphy
sign noted.

Common bile duct:

Diameter: 3 mm in diameter within normal limits.

Liver:

No focal lesion identified. Within normal limits in parenchymal
echogenicity.
IMPRESSION: Unremarkable right upper quadrant ultrasound.

## 2016-11-10 ENCOUNTER — Other Ambulatory Visit: Payer: Self-pay | Admitting: Family Medicine

## 2016-11-10 DIAGNOSIS — F4322 Adjustment disorder with anxiety: Secondary | ICD-10-CM

## 2016-12-10 ENCOUNTER — Other Ambulatory Visit: Payer: Self-pay | Admitting: Urgent Care

## 2016-12-10 DIAGNOSIS — F4322 Adjustment disorder with anxiety: Secondary | ICD-10-CM

## 2016-12-12 NOTE — Telephone Encounter (Signed)
Last ov 12/2015 needs ov

## 2017-01-03 DIAGNOSIS — H04123 Dry eye syndrome of bilateral lacrimal glands: Secondary | ICD-10-CM | POA: Diagnosis not present

## 2017-01-03 DIAGNOSIS — H43811 Vitreous degeneration, right eye: Secondary | ICD-10-CM | POA: Diagnosis not present

## 2017-01-03 DIAGNOSIS — H53451 Other localized visual field defect, right eye: Secondary | ICD-10-CM | POA: Diagnosis not present

## 2017-01-09 ENCOUNTER — Other Ambulatory Visit: Payer: Self-pay | Admitting: Physician Assistant

## 2017-01-09 ENCOUNTER — Other Ambulatory Visit: Payer: Self-pay | Admitting: Family Medicine

## 2017-01-09 DIAGNOSIS — E038 Other specified hypothyroidism: Secondary | ICD-10-CM

## 2017-01-09 DIAGNOSIS — F4322 Adjustment disorder with anxiety: Secondary | ICD-10-CM

## 2017-01-17 ENCOUNTER — Ambulatory Visit (INDEPENDENT_AMBULATORY_CARE_PROVIDER_SITE_OTHER): Payer: 59 | Admitting: Physician Assistant

## 2017-01-17 ENCOUNTER — Encounter: Payer: Self-pay | Admitting: Physician Assistant

## 2017-01-17 VITALS — BP 102/66 | HR 89 | Temp 98.0°F | Resp 16 | Ht 65.5 in | Wt 112.4 lb

## 2017-01-17 DIAGNOSIS — E038 Other specified hypothyroidism: Secondary | ICD-10-CM

## 2017-01-17 DIAGNOSIS — F4322 Adjustment disorder with anxiety: Secondary | ICD-10-CM | POA: Diagnosis not present

## 2017-01-17 NOTE — Patient Instructions (Signed)
     IF you received an x-ray today, you will receive an invoice from [facility]. Please contact [facility] at [phone] with questions or concerns regarding your invoice.   IF you received labwork today, you will receive an invoice from [facility]. Please contact [facility] at [phone] with questions or concerns regarding your invoice.   Our billing staff will not be able to assist you with questions regarding bills from these companies.  You will be contacted with the lab results as soon as they are available. The fastest way to get your results is to activate your My Chart account. Instructions are located on the last page of this paperwork. If you have not heard from us regarding the results in 2 weeks, please contact this office.     

## 2017-01-18 LAB — TSH: TSH: 3.28 u[IU]/mL (ref 0.450–4.500)

## 2017-01-18 MED ORDER — CITALOPRAM HYDROBROMIDE 10 MG PO TABS: 10.0000 mg | ORAL_TABLET | Freq: Every day | ORAL | 3 refills | Status: DC

## 2017-01-18 MED ORDER — LEVOTHYROXINE SODIUM 50 MCG PO TABS: 50.0000 ug | ORAL_TABLET | Freq: Every day | ORAL | 3 refills | Status: DC

## 2017-01-18 NOTE — Progress Notes (Signed)
[facility] [address], [place] [address] [phone]  Date:  01/17/2017   Name:  Renee Watson   DOB:  [birth year]   MRN:  [id number]  PCP:  [name], MD    History of Present Illness:  Renee Watson is a 61 y.o. female patient who presents to [facility] for medication refill.  Patient is here for medication refill. She states that she has no concerns at this time. Compliant on thyroid medication.   Hypothyroidism Renee Watson is a 61 y.o. female who presents for follow up of hypothyroidism. Current symptoms: none . Patient denies change in energy level, diarrhea, heat / cold intolerance, nervousness, palpitations and weight changes. Symptoms have been well-controlled.  Anxiety Well controlled.  She states she feels "even".  No panic attacks.  She feels that she does not get as upset about certain stressors such as her work life.     Patient Active Problem List   Diagnosis Date Noted  . Elevated alkaline phosphatase level 03/03/2015  . Underweight 01/10/2015  . Unspecified hypothyroidism 01/02/2014    Past Medical History:  Diagnosis Date  . Hyperbilirubinemia   . Thyroid disease     No past surgical history on file.  Social History  Substance Use Topics  . Smoking status: Never Smoker  . Smokeless tobacco: Never Used  . Alcohol use 0.0 oz/week    Family History  Problem Relation Age of Onset  . Hyperlipidemia Mother   . Diabetes Mother     No Known Allergies  Medication list has been reviewed and updated.  Current Outpatient Prescriptions on File Prior to Visit  Medication Sig Dispense Refill  . levothyroxine (SYNTHROID, LEVOTHROID) 50 MCG tablet take 1 tablet by mouth once daily BEFORE BREAKFAST 30 tablet 0  . citalopram (CELEXA) 10 MG tablet take 1 tablet by mouth once daily (Patient not taking: Reported on 01/17/2017) 15 tablet 0   No current facility-administered medications on file prior to visit.      ROS ROS otherwise unremarkable unless listed above.   Physical Examination: BP 102/66 (BP Location: Left Arm, Patient Position: Sitting, Cuff Size: Normal)   Pulse 89   Temp 98 F (36.7 C) (Oral)   Resp 16   Ht 5' 5.5" (1.664 m)   Wt 112 lb 6.4 oz (51 kg)   SpO2 98%   BMI 18.42 kg/m  Ideal Body Weight: Weight in (lb) to have BMI = 25: 152.2  Physical Exam  Constitutional: She is oriented to person, place, and time. She appears well-developed and well-nourished. No distress.  HENT:  Head: Normocephalic and atraumatic.  Right Ear: External ear normal.  Left Ear: External ear normal.  Eyes: Conjunctivae and EOM are normal. Pupils are equal, round, and reactive to light.  Neck: No thyroid mass and no thyromegaly present.  Cardiovascular: Normal rate and regular rhythm.  Exam reveals no gallop and no friction rub.   No murmur heard. Pulmonary/Chest: Effort normal. No respiratory distress.  Neurological: She is alert and oriented to person, place, and time.  Skin: She is not diaphoretic.  Psychiatric: She has a normal mood and affect. Her behavior is normal.     Assessment and Plan: Renee Watson is a 61 y.o. female who is here today for medication refill.  Other specified hypothyroidism - Plan: TSH, levothyroxine (SYNTHROID, LEVOTHROID) 50 MCG tablet --stable, refill given  Adjustment disorder with anxious mood - Plan: citalopram (CELEXA) 10 MG tablet --stable, refill given.  Ivar Drape, PA-C [facility] 1/24/20187:44 AM

## 2017-10-17 DIAGNOSIS — Z23 Encounter for immunization: Secondary | ICD-10-CM | POA: Diagnosis not present

## 2018-01-16 ENCOUNTER — Telehealth: Payer: Self-pay | Admitting: Physician Assistant

## 2018-01-16 NOTE — Telephone Encounter (Signed)
Spoke with pt to remind them of their appt tomorrow.  Reminded them of appt time, provider and building.   Reminded them to please be 15 minutes early. Also reminded them of the 10 minute late policy.

## 2018-01-17 ENCOUNTER — Encounter: Payer: Self-pay | Admitting: Physician Assistant

## 2018-01-17 ENCOUNTER — Ambulatory Visit (INDEPENDENT_AMBULATORY_CARE_PROVIDER_SITE_OTHER): Payer: 59 | Admitting: Physician Assistant

## 2018-01-17 ENCOUNTER — Other Ambulatory Visit: Payer: Self-pay

## 2018-01-17 VITALS — BP 110/70 | HR 84 | Temp 98.1°F | Resp 16 | Ht 66.14 in | Wt 112.0 lb

## 2018-01-17 DIAGNOSIS — E039 Hypothyroidism, unspecified: Secondary | ICD-10-CM

## 2018-01-17 DIAGNOSIS — E038 Other specified hypothyroidism: Secondary | ICD-10-CM

## 2018-01-17 DIAGNOSIS — F4322 Adjustment disorder with anxiety: Secondary | ICD-10-CM

## 2018-01-17 MED ORDER — LEVOTHYROXINE SODIUM 50 MCG PO TABS: 50.0000 ug | ORAL_TABLET | Freq: Every day | ORAL | 3 refills | Status: DC

## 2018-01-17 MED ORDER — CITALOPRAM HYDROBROMIDE 10 MG PO TABS: 10.0000 mg | ORAL_TABLET | Freq: Every day | ORAL | 0 refills | Status: DC

## 2018-01-17 NOTE — Patient Instructions (Signed)
     IF you received an x-ray today, you will receive an invoice from [facility]. Please contact [facility] at [phone] with questions or concerns regarding your invoice.   IF you received labwork today, you will receive an invoice from [facility]. Please contact [facility] at [phone] with questions or concerns regarding your invoice.   Our billing staff will not be able to assist you with questions regarding bills from these companies.  You will be contacted with the lab results as soon as they are available. The fastest way to get your results is to activate your My Chart account. Instructions are located on the last page of this paperwork. If you have not heard from us regarding the results in 2 weeks, please contact this office.     

## 2018-01-17 NOTE — Progress Notes (Signed)
[facility] AT [place], [place] [id number] [phone]  Date:  01/17/2018   Name:  Renee Watson   DOB:  26-Nov-1956   MRN:  [id number]  PCP:  Darreld Mclean, MD    History of Present Illness:  MERTHA Watson is a 62 y.o. female patient who presents to PCP with  Chief Complaint  Patient presents with  . Hypothyroidism    follow-up  . Medication Refill     Patient is here for refill of the levothyroxine.  She reports that she is compliant without any side effects.   She is having some anxiety and sometimes feeling "blue".  She is off-setting this with activity and socializing.  She is exercising.  No si/hi.  Not seeing a counselor.  She stopped the celexa months ago when she developed abdominal pain.  She thought it was the celexa.  She had diarrhea.  After discontinuing, her diarrhea resolved.  She also notes that she was having a lot of stressors at the time. Some difficulty with sleeping.  She is drinking cherry tart and melatonin.    Patient Active Problem List   Diagnosis Date Noted  . Elevated alkaline phosphatase level 03/03/2015  . Underweight 01/10/2015  . Unspecified hypothyroidism 01/02/2014    Past Medical History:  Diagnosis Date  . Hyperbilirubinemia   . Thyroid disease     History reviewed. No pertinent surgical history.  Social History   Tobacco Use  . Smoking status: Never Smoker  . Smokeless tobacco: Never Used  Substance Use Topics  . Alcohol use: Yes    Alcohol/week: 0.0 oz  . Drug use: No    Family History  Problem Relation Age of Onset  . Hyperlipidemia Mother   . Diabetes Mother     No Known Allergies  Medication list has been reviewed and updated.  Current Outpatient Medications on File Prior to Visit  Medication Sig Dispense Refill  . citalopram (CELEXA) 10 MG tablet Take 1 tablet (10 mg total) by mouth daily. 90 tablet 3  . levothyroxine (SYNTHROID, LEVOTHROID) 50 MCG tablet Take 1 tablet (50 mcg total)  by mouth daily before breakfast. (Patient not taking: Reported on 01/17/2018) 90 tablet 3   No current facility-administered medications on file prior to visit.     ROS ROS otherwise unremarkable unless listed above.  Physical Examination: BP 110/70   Pulse 84   Temp 98.1 F (36.7 C) (Oral)   Resp 16   Ht 5' 6.14" (1.68 m)   Wt 112 lb (50.8 kg)   SpO2 98%   BMI 18.00 kg/m  Ideal Body Weight: Weight in (lb) to have BMI = 25: 155.2  Physical Exam  Constitutional: She is oriented to person, place, and time. She appears well-developed and well-nourished. No distress.  HENT:  Head: Normocephalic and atraumatic.  Right Ear: External ear normal.  Left Ear: External ear normal.  Eyes: Conjunctivae and EOM are normal. Pupils are equal, round, and reactive to light.  Cardiovascular: Normal rate, regular rhythm, normal heart sounds and intact distal pulses. Exam reveals no friction rub.  No murmur heard. Pulmonary/Chest: Effort normal. No respiratory distress.  Neurological: She is alert and oriented to person, place, and time.  Skin: She is not diaphoretic.  Psychiatric: She has a normal mood and affect. Her behavior is normal.     Assessment and Plan: Renee Watson is a 62 y.o. female who is here today for cc of  Chief Complaint  Patient presents with  . Hypothyroidism    follow-up  . Medication Refill  --thyroid function stable, filling for 1 year. --refilling the celexa at this time.  She will report if the abdominal pain returns, though this has been a chronic medication.  Likely separate cause.  Hypothyroidism, unspecified type - Plan: TSH  Adjustment disorder with anxious mood - Plan: citalopram (CELEXA) 10 MG tablet  Other specified hypothyroidism - Plan: levothyroxine (SYNTHROID, LEVOTHROID) 50 MCG tablet  Ivar Drape, PA-C [facility] 2/5/201911:58 AM

## 2018-01-18 LAB — TSH: TSH: 2.82 u[IU]/mL (ref 0.450–4.500)

## 2018-04-04 ENCOUNTER — Encounter: Payer: Self-pay | Admitting: Physician Assistant

## 2018-10-08 DIAGNOSIS — Z23 Encounter for immunization: Secondary | ICD-10-CM | POA: Diagnosis not present

## 2019-01-18 ENCOUNTER — Ambulatory Visit (INDEPENDENT_AMBULATORY_CARE_PROVIDER_SITE_OTHER): Payer: 59 | Admitting: Emergency Medicine

## 2019-01-18 ENCOUNTER — Other Ambulatory Visit: Payer: Self-pay

## 2019-01-18 ENCOUNTER — Encounter: Payer: Self-pay | Admitting: Emergency Medicine

## 2019-01-18 VITALS — BP 123/76 | HR 91 | Temp 98.5°F | Ht 65.5 in | Wt 109.0 lb

## 2019-01-18 DIAGNOSIS — E038 Other specified hypothyroidism: Secondary | ICD-10-CM

## 2019-01-18 DIAGNOSIS — E039 Hypothyroidism, unspecified: Secondary | ICD-10-CM | POA: Diagnosis not present

## 2019-01-18 DIAGNOSIS — F4322 Adjustment disorder with anxiety: Secondary | ICD-10-CM | POA: Diagnosis not present

## 2019-01-18 MED ORDER — LEVOTHYROXINE SODIUM 50 MCG PO TABS: 50.0000 ug | ORAL_TABLET | Freq: Every day | ORAL | 3 refills | Status: DC

## 2019-01-18 MED ORDER — CITALOPRAM HYDROBROMIDE 10 MG PO TABS: 10.0000 mg | ORAL_TABLET | Freq: Every day | ORAL | 3 refills | Status: DC

## 2019-01-18 NOTE — Progress Notes (Signed)
Lab Results  Component Value Date   TSH 2.820 01/17/2018   Renee Watson 63 y.o.   Chief Complaint  Patient presents with  . Medication Refill    celexa and synthroid    HISTORY OF PRESENT ILLNESS: This is a 63 y.o. female here for follow-up of thyroid condition and depression.  Needs medication refill of Celexa and Synthroid.  Has had a cold for about a week but starting to feel better now.  No other complaints or medical concerns today.  HPI   Prior to Admission medications   Medication Sig Start Date End Date Taking? Authorizing Provider  citalopram (CELEXA) 10 MG tablet Take 1 tablet (10 mg total) by mouth daily. 01/17/18  Yes Ivar Drape D, PA  levothyroxine (SYNTHROID, LEVOTHROID) 50 MCG tablet Take 1 tablet (50 mcg total) by mouth daily before breakfast. 01/17/18  Yes Ivar Drape D, PA    No Known Allergies  Patient Active Problem List   Diagnosis Date Noted  . Elevated alkaline phosphatase level 03/03/2015  . Underweight 01/10/2015  . Unspecified hypothyroidism 01/02/2014    Past Medical History:  Diagnosis Date  . Hyperbilirubinemia   . Thyroid disease     No past surgical history on file.  Social History   Socioeconomic History  . Marital status: Divorced    Spouse name: Not on file  . Number of children: Not on file  . Years of education: Not on file  . Highest education level: Not on file  Occupational History  . Not on file  Social Needs  . Financial resource strain: Not on file  . Food insecurity:    Worry: Not on file    Inability: Not on file  . Transportation needs:    Medical: Not on file    Non-medical: Not on file  Tobacco Use  . Smoking status: Never Smoker  . Smokeless tobacco: Never Used  Substance and Sexual Activity  . Alcohol use: Yes    Alcohol/week: 0.0 standard drinks  . Drug use: No  . Sexual activity: Not on file  Lifestyle  . Physical activity:    Days per week: Not on file    Minutes per session:  Not on file  . Stress: Not on file  Relationships  . Social connections:    Talks on phone: Not on file    Gets together: Not on file    Attends religious service: Not on file    Active member of club or organization: Not on file    Attends meetings of clubs or organizations: Not on file    Relationship status: Not on file  . Intimate partner violence:    Fear of current or ex partner: Not on file    Emotionally abused: Not on file    Physically abused: Not on file    Forced sexual activity: Not on file  Other Topics Concern  . Not on file  Social History Narrative  . Not on file    Family History  Problem Relation Age of Onset  . Hyperlipidemia Mother   . Diabetes Mother      Review of Systems  Constitutional: Negative.  Negative for chills and fever.  HENT: Negative.  Negative for hearing loss and sore throat.   Eyes: Negative.  Negative for blurred vision and double vision.  Respiratory: Negative.  Negative for cough and shortness of breath.   Cardiovascular: Negative.  Negative for chest pain and palpitations.  Gastrointestinal: Negative.  Negative for abdominal  pain, diarrhea, nausea and vomiting.  Genitourinary: Negative.  Negative for dysuria and hematuria.  Musculoskeletal: Negative.  Negative for back pain, myalgias and neck pain.  Skin: Negative.  Negative for rash.  Neurological: Negative.  Negative for dizziness and headaches.  Endo/Heme/Allergies: Negative.   All other systems reviewed and are negative.   Vitals:   01/18/19 1407  BP: 123/76  Pulse: 91  Temp: 98.5 F (36.9 C)  SpO2: 97%    Physical Exam Vitals signs reviewed.  Constitutional:      Appearance: Normal appearance.  HENT:     Head: Normocephalic and atraumatic.     Nose: Nose normal.     Mouth/Throat:     Mouth: Mucous membranes are moist.     Pharynx: Oropharynx is clear.  Eyes:     Extraocular Movements: Extraocular movements intact.     Conjunctiva/sclera: Conjunctivae  normal.     Pupils: Pupils are equal, round, and reactive to light.  Neck:     Musculoskeletal: Normal range of motion and neck supple.  Cardiovascular:     Rate and Rhythm: Normal rate and regular rhythm.     Heart sounds: Normal heart sounds.  Pulmonary:     Effort: Pulmonary effort is normal.     Breath sounds: Normal breath sounds.  Abdominal:     Palpations: Abdomen is soft.     Tenderness: There is no abdominal tenderness.  Musculoskeletal: Normal range of motion.  Skin:    General: Skin is warm and dry.     Capillary Refill: Capillary refill takes less than 2 seconds.  Neurological:     General: No focal deficit present.     Mental Status: She is alert and oriented to person, place, and time.     Sensory: No sensory deficit.     Motor: No weakness.  Psychiatric:        Mood and Affect: Mood normal.        Behavior: Behavior normal.    A total of 25 minutes was spent in the room with the patient, greater than 50% of which was in counseling/coordination of care regarding diagnosis, treatment, medication, and need for follow-up.   ASSESSMENT & PLAN: Renee Watson was seen today for medication refill.  Diagnoses and all orders for this visit:  Hypothyroidism, unspecified type -     Thyroid Panel With TSH  Other specified hypothyroidism -     levothyroxine (SYNTHROID, LEVOTHROID) 50 MCG tablet; Take 1 tablet (50 mcg total) by mouth daily before breakfast.  Adjustment disorder with anxious mood -     citalopram (CELEXA) 10 MG tablet; Take 1 tablet (10 mg total) by mouth daily.    Patient Instructions       If you have lab work done today you will be contacted with your lab results within the next 2 weeks.  If you have not heard from [place] then please contact us. The fastest way to get your results is to register for My Chart.   IF you received an x-ray today, you will receive an invoice from [facility]. Please contact [facility] at [phone] with  questions or concerns regarding your invoice.   IF you received labwork today, you will receive an invoice from [place]. Please contact [facility] at [phone] with questions or concerns regarding your invoice.   Our billing staff will not be able to assist you with questions regarding bills from these companies.  You will be contacted with the lab results as soon as they  are available. The fastest way to get your results is to activate your My Chart account. Instructions are located on the last page of this paperwork. If you have not heard from [place] regarding the results in 2 weeks, please contact this office.      Hypothyroidism  Hypothyroidism is when the thyroid gland does not make enough of certain hormones (it is underactive). The thyroid gland is a small gland located in the lower front part of the neck, just in front of the windpipe (trachea). This gland makes hormones that help control how the body uses food for energy (metabolism) as well as how the heart and brain function. These hormones also play a role in keeping your bones strong. When the thyroid is underactive, it produces too little of the hormones thyroxine (T4) and triiodothyronine (T3). What are the causes? This condition may be caused by:  Hashimoto's disease. This is a disease in which the body's disease-fighting system (immune system) attacks the thyroid gland. This is the most common cause.  Viral infections.  Pregnancy.  Certain medicines.  Birth defects.  Past radiation treatments to the head or neck for cancer.  Past treatment with radioactive iodine.  Past exposure to radiation in the environment.  Past surgical removal of part or all of the thyroid.  Problems with a gland in the center of the brain (pituitary gland).  Lack of enough iodine in the diet. What increases the risk? You are more likely to develop this condition if:  You are female.  You have a family history of thyroid  conditions.  You use a medicine called lithium.  You take medicines that affect the immune system (immunosuppressants). What are the signs or symptoms? Symptoms of this condition include:  Feeling as though you have no energy (lethargy).  Not being able to tolerate cold.  Weight gain that is not explained by a change in diet or exercise habits.  Lack of appetite.  Dry skin.  Coarse hair.  Menstrual irregularity.  Slowing of thought processes.  Constipation.  Sadness or depression. How is this diagnosed? This condition may be diagnosed based on:  Your symptoms, your medical history, and a physical exam.  Blood tests. You may also have imaging tests, such as an ultrasound or MRI. How is this treated? This condition is treated with medicine that replaces the thyroid hormones that your body does not make. After you begin treatment, it may take several weeks for symptoms to go away. Follow these instructions at home:  Take over-the-counter and prescription medicines only as told by your health care provider.  If you start taking any new medicines, tell your health care provider.  Keep all follow-up visits as told by your health care provider. This is important. ? As your condition improves, your dosage of thyroid hormone medicine may change. ? You will need to have blood tests regularly so that your health care provider can monitor your condition. Contact a health care provider if:  Your symptoms do not get better with treatment.  You are taking thyroid replacement medicine and you: ? Sweat a lot. ? Have tremors. ? Feel anxious. ? Lose weight rapidly. ? Cannot tolerate heat. ? Have emotional swings. ? Have diarrhea. ? Feel weak. Get help right away if you have:  Chest pain.  An irregular heartbeat.  A rapid heartbeat.  Difficulty breathing. Summary  Hypothyroidism is when the thyroid gland does not make enough of certain hormones (it is  underactive).  When the thyroid  is underactive, it produces too little of the hormones thyroxine (T4) and triiodothyronine (T3).  The most common cause is Hashimoto's disease, a disease in which the body's disease-fighting system (immune system) attacks the thyroid gland. The condition can also be caused by viral infections, medicine, pregnancy, or past radiation treatment to the head or neck.  Symptoms may include weight gain, dry skin, constipation, feeling as though you do not have energy, and not being able to tolerate cold.  This condition is treated with medicine to replace the thyroid hormones that your body does not make. This information is not intended to replace advice given to you by your health care provider. Make sure you discuss any questions you have with your health care provider. Document Released: 12/12/2005 Document Revised: 11/22/2017 Document Reviewed: 11/22/2017 Elsevier Interactive Patient Education  2019 Elsevier Inc.      Agustina Caroli, MD [facility]

## 2019-01-18 NOTE — Patient Instructions (Addendum)
If you have lab work done today you will be contacted with your lab results within the next 2 weeks.  If you have not heard from [place] then please contact us. The fastest way to get your results is to register for My Chart.   IF you received an x-ray today, you will receive an invoice from [facility]. Please contact [facility] at [phone] with questions or concerns regarding your invoice.   IF you received labwork today, you will receive an invoice from [place]. Please contact [facility] at [phone] with questions or concerns regarding your invoice.   Our billing staff will not be able to assist you with questions regarding bills from these companies.  You will be contacted with the lab results as soon as they are available. The fastest way to get your results is to activate your My Chart account. Instructions are located on the last page of this paperwork. If you have not heard from [place] regarding the results in 2 weeks, please contact this office.      Hypothyroidism  Hypothyroidism is when the thyroid gland does not make enough of certain hormones (it is underactive). The thyroid gland is a small gland located in the lower front part of the neck, just in front of the windpipe (trachea). This gland makes hormones that help control how the body uses food for energy (metabolism) as well as how the heart and brain function. These hormones also play a role in keeping your bones strong. When the thyroid is underactive, it produces too little of the hormones thyroxine (T4) and triiodothyronine (T3). What are the causes? This condition may be caused by:  Hashimoto's disease. This is a disease in which the body's disease-fighting system (immune system) attacks the thyroid gland. This is the most common cause.  Viral infections.  Pregnancy.  Certain medicines.  Birth defects.  Past radiation treatments to the head or neck for cancer.  Past treatment with  radioactive iodine.  Past exposure to radiation in the environment.  Past surgical removal of part or all of the thyroid.  Problems with a gland in the center of the brain (pituitary gland).  Lack of enough iodine in the diet. What increases the risk? You are more likely to develop this condition if:  You are female.  You have a family history of thyroid conditions.  You use a medicine called lithium.  You take medicines that affect the immune system (immunosuppressants). What are the signs or symptoms? Symptoms of this condition include:  Feeling as though you have no energy (lethargy).  Not being able to tolerate cold.  Weight gain that is not explained by a change in diet or exercise habits.  Lack of appetite.  Dry skin.  Coarse hair.  Menstrual irregularity.  Slowing of thought processes.  Constipation.  Sadness or depression. How is this diagnosed? This condition may be diagnosed based on:  Your symptoms, your medical history, and a physical exam.  Blood tests. You may also have imaging tests, such as an ultrasound or MRI. How is this treated? This condition is treated with medicine that replaces the thyroid hormones that your body does not make. After you begin treatment, it may take several weeks for symptoms to go away. Follow these instructions at home:  Take over-the-counter and prescription medicines only as told by your health care provider.  If you start taking any new medicines, tell your health care provider.  Keep all follow-up visits as told by  your health care provider. This is important. ? As your condition improves, your dosage of thyroid hormone medicine may change. ? You will need to have blood tests regularly so that your health care provider can monitor your condition. Contact a health care provider if:  Your symptoms do not get better with treatment.  You are taking thyroid replacement medicine and you: ? Sweat a lot. ? Have  tremors. ? Feel anxious. ? Lose weight rapidly. ? Cannot tolerate heat. ? Have emotional swings. ? Have diarrhea. ? Feel weak. Get help right away if you have:  Chest pain.  An irregular heartbeat.  A rapid heartbeat.  Difficulty breathing. Summary  Hypothyroidism is when the thyroid gland does not make enough of certain hormones (it is underactive).  When the thyroid is underactive, it produces too little of the hormones thyroxine (T4) and triiodothyronine (T3).  The most common cause is Hashimoto's disease, a disease in which the body's disease-fighting system (immune system) attacks the thyroid gland. The condition can also be caused by viral infections, medicine, pregnancy, or past radiation treatment to the head or neck.  Symptoms may include weight gain, dry skin, constipation, feeling as though you do not have energy, and not being able to tolerate cold.  This condition is treated with medicine to replace the thyroid hormones that your body does not make. This information is not intended to replace advice given to you by your health care provider. Make sure you discuss any questions you have with your health care provider. Document Released: 12/12/2005 Document Revised: 11/22/2017 Document Reviewed: 11/22/2017 Elsevier Interactive Patient Education  2019 [employer].

## 2019-01-19 LAB — THYROID PANEL WITH TSH
Free Thyroxine Index: 2.4 (ref 1.2–4.9)
T3 UPTAKE RATIO: 28 % (ref 24–39)
T4 TOTAL: 8.4 ug/dL (ref 4.5–12.0)
TSH: 2.85 u[IU]/mL (ref 0.450–4.500)

## 2019-01-22 ENCOUNTER — Encounter: Payer: Self-pay | Admitting: Radiology

## 2019-03-14 DIAGNOSIS — D224 Melanocytic nevi of scalp and neck: Secondary | ICD-10-CM | POA: Diagnosis not present

## 2019-03-14 DIAGNOSIS — D234 Other benign neoplasm of skin of scalp and neck: Secondary | ICD-10-CM | POA: Diagnosis not present

## 2019-12-20 ENCOUNTER — Other Ambulatory Visit: Payer: Self-pay | Admitting: Emergency Medicine

## 2019-12-20 DIAGNOSIS — E038 Other specified hypothyroidism: Secondary | ICD-10-CM

## 2019-12-20 NOTE — Telephone Encounter (Signed)
Forwarding medication refill request to the clinical pool for review. 

## 2020-01-08 ENCOUNTER — Other Ambulatory Visit: Payer: Self-pay

## 2020-01-08 ENCOUNTER — Encounter: Payer: Self-pay | Admitting: Emergency Medicine

## 2020-01-08 ENCOUNTER — Ambulatory Visit (INDEPENDENT_AMBULATORY_CARE_PROVIDER_SITE_OTHER): Payer: BC Managed Care – PPO | Admitting: Emergency Medicine

## 2020-01-08 VITALS — BP 100/64 | HR 94 | Temp 98.3°F | Ht 65.0 in | Wt 110.6 lb

## 2020-01-08 DIAGNOSIS — E039 Hypothyroidism, unspecified: Secondary | ICD-10-CM

## 2020-01-08 DIAGNOSIS — F4322 Adjustment disorder with anxiety: Secondary | ICD-10-CM

## 2020-01-08 MED ORDER — LEVOTHYROXINE SODIUM 50 MCG PO TABS: 50.0000 ug | ORAL_TABLET | Freq: Every day | ORAL | 3 refills | Status: DC

## 2020-01-08 MED ORDER — CITALOPRAM HYDROBROMIDE 10 MG PO TABS: 10.0000 mg | ORAL_TABLET | Freq: Every day | ORAL | 3 refills | Status: DC

## 2020-01-08 NOTE — Progress Notes (Signed)
Renee Watson 64 y.o.   Chief Complaint  Patient presents with  . Medication Refill    HISTORY OF PRESENT ILLNESS: This is a 64 y.o. female with history of hypothyroidism and adjustment disorder with anxious mood here for follow-up and medication refill.  Has no complaints or medical concerns today. Presently on Celexa 10 mg daily and Synthroid 50 mcg daily. Health maintenance reviewed with patient.  Advised about mammogram, Pap smear, and colonoscopy.  HPI   Prior to Admission medications   Medication Sig Start Date End Date Taking? Authorizing Provider  citalopram (CELEXA) 10 MG tablet Take 1 tablet (10 mg total) by mouth daily. 01/18/19  Yes Lamario Mani, Ines Bloomer, MD  levothyroxine (SYNTHROID, LEVOTHROID) 50 MCG tablet Take 1 tablet (50 mcg total) by mouth daily before breakfast. 01/18/19  Yes Tamila Gaulin, Ines Bloomer, MD    No Known Allergies  Patient Active Problem List   Diagnosis Date Noted  . Adjustment disorder with anxious mood 01/18/2019  . Elevated alkaline phosphatase level 03/03/2015  . Underweight 01/10/2015  . Other specified hypothyroidism 01/02/2014    Past Medical History:  Diagnosis Date  . Hyperbilirubinemia   . Thyroid disease     History reviewed. No pertinent surgical history.  Social History   Socioeconomic History  . Marital status: Divorced    Spouse name: Not on file  . Number of children: Not on file  . Years of education: Not on file  . Highest education level: Not on file  Occupational History  . Not on file  Tobacco Use  . Smoking status: Never Smoker  . Smokeless tobacco: Never Used  Substance and Sexual Activity  . Alcohol use: Yes    Alcohol/week: 0.0 standard drinks  . Drug use: No  . Sexual activity: Not on file  Other Topics Concern  . Not on file  Social History Narrative  . Not on file   Social Determinants of Health   Financial Resource Strain:   . Difficulty of Paying Living Expenses: Not on file  Food  Insecurity:   . Worried About Charity fundraiser in the Last Year: Not on file  . Ran Out of Food in the Last Year: Not on file  Transportation Needs:   . Lack of Transportation (Medical): Not on file  . Lack of Transportation (Non-Medical): Not on file  Physical Activity:   . Days of Exercise per Week: Not on file  . Minutes of Exercise per Session: Not on file  Stress:   . Feeling of Stress : Not on file  Social Connections:   . Frequency of Communication with Friends and Family: Not on file  . Frequency of Social Gatherings with Friends and Family: Not on file  . Attends Religious Services: Not on file  . Active Member of Clubs or Organizations: Not on file  . Attends Archivist Meetings: Not on file  . Marital Status: Not on file  Intimate Partner Violence:   . Fear of Current or Ex-Partner: Not on file  . Emotionally Abused: Not on file  . Physically Abused: Not on file  . Sexually Abused: Not on file    Family History  Problem Relation Age of Onset  . Hyperlipidemia Mother   . Diabetes Mother      Review of Systems  Constitutional: Negative.  Negative for chills and fever.  HENT: Negative.  Negative for congestion and sore throat.   Respiratory: Negative.  Negative for cough and shortness of breath.  Cardiovascular: Negative.  Negative for chest pain and palpitations.  Gastrointestinal: Negative.  Negative for abdominal pain, blood in stool, diarrhea, melena, nausea and vomiting.  Genitourinary: Negative.  Negative for dysuria and hematuria.  Musculoskeletal: Negative.   Skin: Negative.  Negative for rash.  Neurological: Negative.  Negative for dizziness and headaches.  All other systems reviewed and are negative.   Today's Vitals   01/08/20 1027  BP: 100/64  Pulse: 94  Temp: 98.3 F (36.8 C)  TempSrc: Temporal  SpO2: 98%  Weight: 110 lb 9.6 oz (50.2 kg)  Height: 5\' 5"  (1.651 m)   Body mass index is 18.4 kg/m.  Physical Exam Vitals  reviewed.  Constitutional:      Appearance: Normal appearance.  HENT:     Head: Normocephalic.  Eyes:     Extraocular Movements: Extraocular movements intact.     Conjunctiva/sclera: Conjunctivae normal.     Pupils: Pupils are equal, round, and reactive to light.  Cardiovascular:     Rate and Rhythm: Normal rate and regular rhythm.     Pulses: Normal pulses.     Heart sounds: Normal heart sounds.  Pulmonary:     Effort: Pulmonary effort is normal.     Breath sounds: Normal breath sounds.  Musculoskeletal:        General: Normal range of motion.     Cervical back: Normal range of motion and neck supple.  Skin:    General: Skin is warm and dry.     Capillary Refill: Capillary refill takes less than 2 seconds.  Neurological:     General: No focal deficit present.     Mental Status: She is alert and oriented to person, place, and time.  Psychiatric:        Mood and Affect: Mood normal.        Behavior: Behavior normal.    A total of 30 minutes was spent in the room with the patient, greater than 50% of which was in counseling/coordination of care regarding review of most recent blood work, review of most recent office visit notes, review of past medical history, medications, health maintenance items, nutrition, need for blood work, and COVID-19 precautions.   ASSESSMENT & PLAN: Imanii was seen today for medication refill.  Diagnoses and all orders for this visit:  Hypothyroidism, unspecified type -     Comprehensive metabolic panel -     Lipid panel -     Thyroid Panel With TSH -     CBC with Differential -     levothyroxine (SYNTHROID) 50 MCG tablet; Take 1 tablet (50 mcg total) by mouth daily before breakfast.  Adjustment disorder with anxious mood -     citalopram (CELEXA) 10 MG tablet; Take 1 tablet (10 mg total) by mouth daily.    Patient Instructions       If you have lab work done today you will be contacted with your lab results within the next 2 weeks.  If  you have not heard from [place] then please contact us. The fastest way to get your results is to register for My Chart.   IF you received an x-ray today, you will receive an invoice from [facility]. Please contact [facility] at [phone] with questions or concerns regarding your invoice.   IF you received labwork today, you will receive an invoice from [facility]. Please contact [facility] at [phone] with questions or concerns regarding your invoice.   Our billing staff will not be able to assist you  with questions regarding bills from these companies.  You will be contacted with the lab results as soon as they are available. The fastest way to get your results is to activate your My Chart account. Instructions are located on the last page of this paperwork. If you have not heard from [place] regarding the results in 2 weeks, please contact this office.     Health Maintenance, Female Adopting a healthy lifestyle and getting preventive care are important in promoting health and wellness. Ask your health care provider about:  The right schedule for you to have regular tests and exams.  Things you can do on your own to prevent diseases and keep yourself healthy. What should I know about diet, weight, and exercise? Eat a healthy diet   Eat a diet that includes plenty of vegetables, fruits, low-fat dairy products, and lean protein.  Do not eat a lot of foods that are high in solid fats, added sugars, or sodium. Maintain a healthy weight Body mass index (BMI) is used to identify weight problems. It estimates body fat based on height and weight. Your health care provider can help determine your BMI and help you achieve or maintain a healthy weight. Get regular exercise Get regular exercise. This is one of the most important things you can do for your health. Most adults should:  Exercise for at least 150 minutes each week. The exercise should increase your heart rate and make  you sweat (moderate-intensity exercise).  Do strengthening exercises at least twice a week. This is in addition to the moderate-intensity exercise.  Spend less time sitting. Even light physical activity can be beneficial. Watch cholesterol and blood lipids Have your blood tested for lipids and cholesterol at 65 years of age, then have this test every 5 years. Have your cholesterol levels checked more often if:  Your lipid or cholesterol levels are high.  You are older than 65 years of age.  You are at high risk for heart disease. What should I know about cancer screening? Depending on your health history and family history, you may need to have cancer screening at various ages. This may include screening for:  Breast cancer.  Cervical cancer.  Colorectal cancer.  Skin cancer.  Lung cancer. What should I know about heart disease, diabetes, and high blood pressure? Blood pressure and heart disease  High blood pressure causes heart disease and increases the risk of stroke. This is more likely to develop in people who have high blood pressure readings, are of African descent, or are overweight.  Have your blood pressure checked: ? Every 3-5 years if you are 73-58 years of age. ? Every year if you are 57 years old or older. Diabetes Have regular diabetes screenings. This checks your fasting blood sugar level. Have the screening done:  Once every three years after age 66 if you are at a normal weight and have a low risk for diabetes.  More often and at a younger age if you are overweight or have a high risk for diabetes. What should I know about preventing infection? Hepatitis B If you have a higher risk for hepatitis B, you should be screened for this virus. Talk with your health care provider to find out if you are at risk for hepatitis B infection. Hepatitis C Testing is recommended for:  Everyone born from [birth year] through 1965.  Anyone with known risk factors for hepatitis  C. Sexually transmitted infections (STIs)  Get screened for STIs, including gonorrhea and  chlamydia, if: ? You are sexually active and are younger than 66 years of age. ? You are older than 65 years of age and your health care provider tells you that you are at risk for this type of infection. ? Your sexual activity has changed since you were last screened, and you are at increased risk for chlamydia or gonorrhea. Ask your health care provider if you are at risk.  Ask your health care provider about whether you are at high risk for HIV. Your health care provider may recommend a prescription medicine to help prevent HIV infection. If you choose to take medicine to prevent HIV, you should first get tested for HIV. You should then be tested every 3 months for as long as you are taking the medicine. Pregnancy  If you are about to stop having your period (premenopausal) and you may become pregnant, seek counseling before you get pregnant.  Take 400 to 800 micrograms (mcg) of folic acid every day if you become pregnant.  Ask for birth control (contraception) if you want to prevent pregnancy. Osteoporosis and menopause Osteoporosis is a disease in which the bones lose minerals and strength with aging. This can result in bone fractures. If you are 49 years old or older, or if you are at risk for osteoporosis and fractures, ask your health care provider if you should:  Be screened for bone loss.  Take a calcium or vitamin D supplement to lower your risk of fractures.  Be given hormone replacement therapy (HRT) to treat symptoms of menopause. Follow these instructions at home: Lifestyle  Do not use any products that contain nicotine or tobacco, such as cigarettes, e-cigarettes, and chewing tobacco. If you need help quitting, ask your health care provider.  Do not use street drugs.  Do not share needles.  Ask your health care provider for help if you need support or information about quitting  drugs. Alcohol use  Do not drink alcohol if: ? Your health care provider tells you not to drink. ? You are pregnant, may be pregnant, or are planning to become pregnant.  If you drink alcohol: ? Limit how much you use to 0-1 drink a day. ? Limit intake if you are breastfeeding.  Be aware of how much alcohol is in your drink. In the [place] one drink equals one 12 oz bottle of beer (355 mL), one 5 oz glass of wine (148 mL), or one 1 oz glass of hard liquor (44 mL). General instructions  Schedule regular health, dental, and eye exams.  Stay current with your vaccines.  Tell your health care provider if: ? You often feel depressed. ? You have ever been abused or do not feel safe at home. Summary  Adopting a healthy lifestyle and getting preventive care are important in promoting health and wellness.  Follow your health care provider's instructions about healthy diet, exercising, and getting tested or screened for diseases.  Follow your health care provider's instructions on monitoring your cholesterol and blood pressure. This information is not intended to replace advice given to you by your health care provider. Make sure you discuss any questions you have with your health care provider. Document Revised: 12/05/2018 Document Reviewed: 12/05/2018 Elsevier Patient Education  2020 Elsevier Inc.      Agustina Caroli, MD [facility]

## 2020-01-08 NOTE — Patient Instructions (Addendum)
   If you have lab work done today you will be contacted with your lab results within the next 2 weeks.  If you have not heard from us then please contact us. The fastest way to get your results is to register for My Chart.   IF you received an x-ray today, you will receive an invoice from [facility]. Please contact [facility] at [phone] with questions or concerns regarding your invoice.   IF you received labwork today, you will receive an invoice from [facility]. Please contact [facility] at [phone] with questions or concerns regarding your invoice.   Our billing staff will not be able to assist you with questions regarding bills from these companies.  You will be contacted with the lab results as soon as they are available. The fastest way to get your results is to activate your My Chart account. Instructions are located on the last page of this paperwork. If you have not heard from us regarding the results in 2 weeks, please contact this office.       Health Maintenance, Female Adopting a healthy lifestyle and getting preventive care are important in promoting health and wellness. Ask your health care provider about:  The right schedule for you to have regular tests and exams.  Things you can do on your own to prevent diseases and keep yourself healthy. What should I know about diet, weight, and exercise? Eat a healthy diet   Eat a diet that includes plenty of vegetables, fruits, low-fat dairy products, and lean protein.  Do not eat a lot of foods that are high in solid fats, added sugars, or sodium. Maintain a healthy weight Body mass index (BMI) is used to identify weight problems. It estimates body fat based on height and weight. Your health care provider can help determine your BMI and help you achieve or maintain a healthy weight. Get regular exercise Get regular exercise. This is one of the most important things you can do for your health. Most  adults should:  Exercise for at least 150 minutes each week. The exercise should increase your heart rate and make you sweat (moderate-intensity exercise).  Do strengthening exercises at least twice a week. This is in addition to the moderate-intensity exercise.  Spend less time sitting. Even light physical activity can be beneficial. Watch cholesterol and blood lipids Have your blood tested for lipids and cholesterol at 65 years of age, then have this test every 5 years. Have your cholesterol levels checked more often if:  Your lipid or cholesterol levels are high.  You are older than 65 years of age.  You are at high risk for heart disease. What should I know about cancer screening? Depending on your health history and family history, you may need to have cancer screening at various ages. This may include screening for:  Breast cancer.  Cervical cancer.  Colorectal cancer.  Skin cancer.  Lung cancer. What should I know about heart disease, diabetes, and high blood pressure? Blood pressure and heart disease  High blood pressure causes heart disease and increases the risk of stroke. This is more likely to develop in people who have high blood pressure readings, are of African descent, or are overweight.  Have your blood pressure checked: ? Every 3-5 years if you are 18-39 years of age. ? Every year if you are 40 years old or older. Diabetes Have regular diabetes screenings. This checks your fasting blood sugar level. Have the screening done:  Once   every three years after age 40 if you are at a normal weight and have a low risk for diabetes.  More often and at a younger age if you are overweight or have a high risk for diabetes. What should I know about preventing infection? Hepatitis B If you have a higher risk for hepatitis B, you should be screened for this virus. Talk with your health care provider to find out if you are at risk for hepatitis B infection. Hepatitis  C Testing is recommended for:  Everyone born from 1945 through 1965.  Anyone with known risk factors for hepatitis C. Sexually transmitted infections (STIs)  Get screened for STIs, including gonorrhea and chlamydia, if: ? You are sexually active and are younger than 65 years of age. ? You are older than 65 years of age and your health care provider tells you that you are at risk for this type of infection. ? Your sexual activity has changed since you were last screened, and you are at increased risk for chlamydia or gonorrhea. Ask your health care provider if you are at risk.  Ask your health care provider about whether you are at high risk for HIV. Your health care provider may recommend a prescription medicine to help prevent HIV infection. If you choose to take medicine to prevent HIV, you should first get tested for HIV. You should then be tested every 3 months for as long as you are taking the medicine. Pregnancy  If you are about to stop having your period (premenopausal) and you may become pregnant, seek counseling before you get pregnant.  Take 400 to 800 micrograms (mcg) of folic acid every day if you become pregnant.  Ask for birth control (contraception) if you want to prevent pregnancy. Osteoporosis and menopause Osteoporosis is a disease in which the bones lose minerals and strength with aging. This can result in bone fractures. If you are 65 years old or older, or if you are at risk for osteoporosis and fractures, ask your health care provider if you should:  Be screened for bone loss.  Take a calcium or vitamin D supplement to lower your risk of fractures.  Be given hormone replacement therapy (HRT) to treat symptoms of menopause. Follow these instructions at home: Lifestyle  Do not use any products that contain nicotine or tobacco, such as cigarettes, e-cigarettes, and chewing tobacco. If you need help quitting, ask your health care provider.  Do not use street  drugs.  Do not share needles.  Ask your health care provider for help if you need support or information about quitting drugs. Alcohol use  Do not drink alcohol if: ? Your health care provider tells you not to drink. ? You are pregnant, may be pregnant, or are planning to become pregnant.  If you drink alcohol: ? Limit how much you use to 0-1 drink a day. ? Limit intake if you are breastfeeding.  Be aware of how much alcohol is in your drink. In the [place] one drink equals one 12 oz bottle of beer (355 mL), one 5 oz glass of wine (148 mL), or one 1 oz glass of hard liquor (44 mL). General instructions  Schedule regular health, dental, and eye exams.  Stay current with your vaccines.  Tell your health care provider if: ? You often feel depressed. ? You have ever been abused or do not feel safe at home. Summary  Adopting a healthy lifestyle and getting preventive care are important in promoting health and   wellness.  Follow your health care provider's instructions about healthy diet, exercising, and getting tested or screened for diseases.  Follow your health care provider's instructions on monitoring your cholesterol and blood pressure. This information is not intended to replace advice given to you by your health care provider. Make sure you discuss any questions you have with your health care provider. Document Revised: 12/05/2018 Document Reviewed: 12/05/2018 Elsevier Patient Education  2020 Elsevier Inc.  

## 2020-01-09 ENCOUNTER — Encounter: Payer: Self-pay | Admitting: Emergency Medicine

## 2020-01-09 LAB — CBC WITH DIFFERENTIAL/PLATELET
Basophils Absolute: 0.1 10*3/uL (ref 0.0–0.2)
Basos: 1 %
EOS (ABSOLUTE): 0.1 10*3/uL (ref 0.0–0.4)
Eos: 1 %
Hematocrit: 44.4 % (ref 34.0–46.6)
Hemoglobin: 14.4 g/dL (ref 11.1–15.9)
Immature Grans (Abs): 0 10*3/uL (ref 0.0–0.1)
Immature Granulocytes: 0 %
Lymphocytes Absolute: 1.5 10*3/uL (ref 0.7–3.1)
Lymphs: 25 %
MCH: 29.1 pg (ref 26.6–33.0)
MCHC: 32.4 g/dL (ref 31.5–35.7)
MCV: 90 fL (ref 79–97)
Monocytes Absolute: 0.5 10*3/uL (ref 0.1–0.9)
Monocytes: 8 %
Neutrophils Absolute: 3.8 10*3/uL (ref 1.4–7.0)
Neutrophils: 65 %
Platelets: 289 10*3/uL (ref 150–450)
RBC: 4.95 x10E6/uL (ref 3.77–5.28)
RDW: 12.5 % (ref 11.7–15.4)
WBC: 5.9 10*3/uL (ref 3.4–10.8)

## 2020-01-09 LAB — COMPREHENSIVE METABOLIC PANEL
ALT: 34 IU/L — ABNORMAL HIGH (ref 0–32)
AST: 34 IU/L (ref 0–40)
Albumin/Globulin Ratio: 1.8 (ref 1.2–2.2)
Albumin: 4.4 g/dL (ref 3.8–4.8)
Alkaline Phosphatase: 166 IU/L — ABNORMAL HIGH (ref 39–117)
BUN/Creatinine Ratio: 33 — ABNORMAL HIGH (ref 12–28)
BUN: 19 mg/dL (ref 8–27)
Bilirubin Total: 1 mg/dL (ref 0.0–1.2)
CO2: 22 mmol/L (ref 20–29)
Calcium: 9.3 mg/dL (ref 8.7–10.3)
Chloride: 103 mmol/L (ref 96–106)
Creatinine, Ser: 0.58 mg/dL (ref 0.57–1.00)
GFR calc Af Amer: 113 mL/min/{1.73_m2} (ref >59)
GFR calc non Af Amer: 98 mL/min/{1.73_m2} (ref >59)
Globulin, Total: 2.5 g/dL (ref 1.5–4.5)
Glucose: 86 mg/dL (ref 65–99)
Potassium: 4.5 mmol/L (ref 3.5–5.2)
Sodium: 140 mmol/L (ref 134–144)
Total Protein: 6.9 g/dL (ref 6.0–8.5)

## 2020-01-09 LAB — LIPID PANEL
Chol/HDL Ratio: 2.5 ratio (ref 0.0–4.4)
Cholesterol, Total: 191 mg/dL (ref 100–199)
HDL: 75 mg/dL (ref >39)
LDL Chol Calc (NIH): 97 mg/dL (ref 0–99)
Triglycerides: 108 mg/dL (ref 0–149)
VLDL Cholesterol Cal: 19 mg/dL (ref 5–40)

## 2020-01-09 LAB — THYROID PANEL WITH TSH
Free Thyroxine Index: 2.7 (ref 1.2–4.9)
T3 Uptake Ratio: 31 % (ref 24–39)
T4, Total: 8.7 ug/dL (ref 4.5–12.0)
TSH: 2.19 u[IU]/mL (ref 0.450–4.500)

## 2020-02-23 ENCOUNTER — Ambulatory Visit: Payer: Self-pay | Attending: Internal Medicine

## 2020-02-23 DIAGNOSIS — Z23 Encounter for immunization: Secondary | ICD-10-CM | POA: Insufficient documentation

## 2020-02-23 NOTE — Progress Notes (Signed)
   Covid-19 Vaccination Clinic  Name:  Renee Watson    MRN: [id number] DOB: [birth year]  02/23/2020  [name] was observed post Covid-19 immunization for 15 minutes without incidence. She was provided with Vaccine Information Sheet and instruction to access the V-Safe system.   [name] was instructed to call 911 with any severe reactions post vaccine: Marland Kitchen Difficulty breathing  . Swelling of your face and throat  . A fast heartbeat  . A bad rash all over your body  . Dizziness and weakness    Immunizations Administered    Name Date Dose VIS Date Route   Pfizer COVID-19 Vaccine 02/23/2020  2:03 PM 0.3 mL 12/06/2019 Intramuscular   Manufacturer: [place]   Lot: [id number]   [place]: [id number]

## 2020-03-03 DIAGNOSIS — L718 Other rosacea: Secondary | ICD-10-CM | POA: Diagnosis not present

## 2020-03-03 DIAGNOSIS — R208 Other disturbances of skin sensation: Secondary | ICD-10-CM | POA: Diagnosis not present

## 2020-03-03 DIAGNOSIS — L309 Dermatitis, unspecified: Secondary | ICD-10-CM | POA: Diagnosis not present

## 2020-03-03 DIAGNOSIS — L820 Inflamed seborrheic keratosis: Secondary | ICD-10-CM | POA: Diagnosis not present

## 2020-03-24 ENCOUNTER — Ambulatory Visit: Payer: Self-pay | Attending: Internal Medicine

## 2020-03-24 DIAGNOSIS — Z23 Encounter for immunization: Secondary | ICD-10-CM

## 2020-03-24 NOTE — Progress Notes (Signed)
   Covid-19 Vaccination Clinic  Name:  Renee Watson    MRN: [id number] DOB: June 28, 1956  03/24/2020  [name] was observed post Covid-19 immunization for 15 minutes without incident. She was provided with Vaccine Information Sheet and instruction to access the V-Safe system.   [name] was instructed to call 911 with any severe reactions post vaccine: Marland Kitchen Difficulty breathing  . Swelling of face and throat  . A fast heartbeat  . A bad rash all over body  . Dizziness and weakness   Immunizations Administered    Name Date Dose VIS Date Route   Pfizer COVID-19 Vaccine 03/24/2020  8:34 AM 0.3 mL 12/06/2019 Intramuscular   Manufacturer: [place]   Lot: [id number]   [place]: [facility]  Name:  Renee Watson    MRN: [id number] DOB: 1956-09-23  03/24/2020  [name] was observed post Covid-19 immunization for 15 minutes without incident. She was provided with Vaccine Information Sheet and instruction to access the V-Safe system.   [name] was instructed to call 911 with any severe reactions post vaccine: Marland Kitchen Difficulty breathing  . Swelling of face and throat  . A fast heartbeat  . A bad rash all over body  . Dizziness and weakness   Immunizations Administered    Name Date Dose VIS Date Route   Pfizer COVID-19 Vaccine 03/24/2020  8:34 AM 0.3 mL 12/06/2019 Intramuscular   Manufacturer: [place]   Lot: [id number]   [place]: [id number]

## 2020-08-28 ENCOUNTER — Other Ambulatory Visit: Payer: Self-pay

## 2020-09-02 DIAGNOSIS — Z20828 Contact with and (suspected) exposure to other viral communicable diseases: Secondary | ICD-10-CM | POA: Diagnosis not present

## 2020-10-06 ENCOUNTER — Ambulatory Visit: Payer: Self-pay | Attending: Internal Medicine

## 2020-10-06 DIAGNOSIS — Z23 Encounter for immunization: Secondary | ICD-10-CM

## 2020-10-06 NOTE — Progress Notes (Signed)
   Covid-19 Vaccination Clinic  Name:  ADAJAH COCKING    MRN: [id number] DOB: August 21, 1956  10/06/2020  Ms. Flater was observed post Covid-19 immunization for 15 minutes without incident. She was provided with Vaccine Information Sheet and instruction to access the V-Safe system.   Ms. Chacko was instructed to call 911 with any severe reactions post vaccine: Marland Kitchen Difficulty breathing  . Swelling of face and throat  . A fast heartbeat  . A bad rash all over body  . Dizziness and weakness

## 2021-01-06 ENCOUNTER — Ambulatory Visit (INDEPENDENT_AMBULATORY_CARE_PROVIDER_SITE_OTHER): Payer: BC Managed Care – PPO | Admitting: Emergency Medicine

## 2021-01-06 ENCOUNTER — Encounter: Payer: Self-pay | Admitting: Emergency Medicine

## 2021-01-06 ENCOUNTER — Other Ambulatory Visit: Payer: Self-pay

## 2021-01-06 ENCOUNTER — Telehealth: Payer: Self-pay | Admitting: Emergency Medicine

## 2021-01-06 VITALS — BP 128/79 | HR 81 | Temp 98.0°F | Resp 16 | Ht 65.0 in | Wt 105.0 lb

## 2021-01-06 DIAGNOSIS — Z23 Encounter for immunization: Secondary | ICD-10-CM

## 2021-01-06 DIAGNOSIS — Z130 Encounter for screening for diseases of the blood and blood-forming organs and certain disorders involving the immune mechanism: Secondary | ICD-10-CM | POA: Diagnosis not present

## 2021-01-06 DIAGNOSIS — Z1329 Encounter for screening for other suspected endocrine disorder: Secondary | ICD-10-CM | POA: Diagnosis not present

## 2021-01-06 DIAGNOSIS — Z13220 Encounter for screening for lipoid disorders: Secondary | ICD-10-CM | POA: Diagnosis not present

## 2021-01-06 DIAGNOSIS — E039 Hypothyroidism, unspecified: Secondary | ICD-10-CM | POA: Diagnosis not present

## 2021-01-06 DIAGNOSIS — Z0000 Encounter for general adult medical examination without abnormal findings: Secondary | ICD-10-CM

## 2021-01-06 DIAGNOSIS — Z1211 Encounter for screening for malignant neoplasm of colon: Secondary | ICD-10-CM

## 2021-01-06 DIAGNOSIS — Z0001 Encounter for general adult medical examination with abnormal findings: Secondary | ICD-10-CM

## 2021-01-06 DIAGNOSIS — Z1231 Encounter for screening mammogram for malignant neoplasm of breast: Secondary | ICD-10-CM | POA: Diagnosis not present

## 2021-01-06 DIAGNOSIS — F4322 Adjustment disorder with anxiety: Secondary | ICD-10-CM | POA: Diagnosis not present

## 2021-01-06 DIAGNOSIS — Z13228 Encounter for screening for other metabolic disorders: Secondary | ICD-10-CM

## 2021-01-06 MED ORDER — CITALOPRAM HYDROBROMIDE 10 MG PO TABS: 10.0000 mg | ORAL_TABLET | Freq: Every day | ORAL | 3 refills | Status: DC

## 2021-01-06 MED ORDER — LEVOTHYROXINE SODIUM 50 MCG PO TABS: 50.0000 ug | ORAL_TABLET | Freq: Every day | ORAL | 3 refills | Status: DC

## 2021-01-06 NOTE — Patient Instructions (Addendum)
   If you have lab work done today you will be contacted with your lab results within the next 2 weeks.  If you have not heard from us then please contact us. The fastest way to get your results is to register for My Chart.   IF you received an x-ray today, you will receive an invoice from [facility]. Please contact [facility] at [phone] with questions or concerns regarding your invoice.   IF you received labwork today, you will receive an invoice from [facility]. Please contact [facility] at [phone] with questions or concerns regarding your invoice.   Our billing staff will not be able to assist you with questions regarding bills from these companies.  You will be contacted with the lab results as soon as they are available. The fastest way to get your results is to activate your My Chart account. Instructions are located on the last page of this paperwork. If you have not heard from us regarding the results in 2 weeks, please contact this office.     Health Maintenance, Female Adopting a healthy lifestyle and getting preventive care are important in promoting health and wellness. Ask your health care provider about:  The right schedule for you to have regular tests and exams.  Things you can do on your own to prevent diseases and keep yourself healthy. What should I know about diet, weight, and exercise? Eat a healthy diet  Eat a diet that includes plenty of vegetables, fruits, low-fat dairy products, and lean protein.  Do not eat a lot of foods that are high in solid fats, added sugars, or sodium.   Maintain a healthy weight Body mass index (BMI) is used to identify weight problems. It estimates body fat based on height and weight. Your health care provider can help determine your BMI and help you achieve or maintain a healthy weight. Get regular exercise Get regular exercise. This is one of the most important things you can do for your health. Most  adults should:  Exercise for at least 150 minutes each week. The exercise should increase your heart rate and make you sweat (moderate-intensity exercise).  Do strengthening exercises at least twice a week. This is in addition to the moderate-intensity exercise.  Spend less time sitting. Even light physical activity can be beneficial. Watch cholesterol and blood lipids Have your blood tested for lipids and cholesterol at 66 years of age, then have this test every 5 years. Have your cholesterol levels checked more often if:  Your lipid or cholesterol levels are high.  You are older than 66 years of age.  You are at high risk for heart disease. What should I know about cancer screening? Depending on your health history and family history, you may need to have cancer screening at various ages. This may include screening for:  Breast cancer.  Cervical cancer.  Colorectal cancer.  Skin cancer.  Lung cancer. What should I know about heart disease, diabetes, and high blood pressure? Blood pressure and heart disease  High blood pressure causes heart disease and increases the risk of stroke. This is more likely to develop in people who have high blood pressure readings, are of African descent, or are overweight.  Have your blood pressure checked: ? Every 3-5 years if you are 18-39 years of age. ? Every year if you are 40 years old or older. Diabetes Have regular diabetes screenings. This checks your fasting blood sugar level. Have the screening done:  Once every   every three years after age 40 if you are at a normal weight and have a low risk for diabetes.  More often and at a younger age if you are overweight or have a high risk for diabetes. What should I know about preventing infection? Hepatitis B If you have a higher risk for hepatitis B, you should be screened for this virus. Talk with your health care provider to find out if you are at risk for hepatitis B infection. Hepatitis  C Testing is recommended for:  Everyone born from 1945 through 1965.  Anyone with known risk factors for hepatitis C. Sexually transmitted infections (STIs)  Get screened for STIs, including gonorrhea and chlamydia, if: ? You are sexually active and are younger than 66 years of age. ? You are older than 66 years of age and your health care provider tells you that you are at risk for this type of infection. ? Your sexual activity has changed since you were last screened, and you are at increased risk for chlamydia or gonorrhea. Ask your health care provider if you are at risk.  Ask your health care provider about whether you are at high risk for HIV. Your health care provider may recommend a prescription medicine to help prevent HIV infection. If you choose to take medicine to prevent HIV, you should first get tested for HIV. You should then be tested every 3 months for as long as you are taking the medicine. Pregnancy  If you are about to stop having your period (premenopausal) and you may become pregnant, seek counseling before you get pregnant.  Take 400 to 800 micrograms (mcg) of folic acid every day if you become pregnant.  Ask for birth control (contraception) if you want to prevent pregnancy. Osteoporosis and menopause Osteoporosis is a disease in which the bones lose minerals and strength with aging. This can result in bone fractures. If you are 65 years old or older, or if you are at risk for osteoporosis and fractures, ask your health care provider if you should:  Be screened for bone loss.  Take a calcium or vitamin D supplement to lower your risk of fractures.  Be given hormone replacement therapy (HRT) to treat symptoms of menopause. Follow these instructions at home: Lifestyle  Do not use any products that contain nicotine or tobacco, such as cigarettes, e-cigarettes, and chewing tobacco. If you need help quitting, ask your health care provider.  Do not use street  drugs.  Do not share needles.  Ask your health care provider for help if you need support or information about quitting drugs. Alcohol use  Do not drink alcohol if: ? Your health care provider tells you not to drink. ? You are pregnant, may be pregnant, or are planning to become pregnant.  If you drink alcohol: ? Limit how much you use to 0-1 drink a day. ? Limit intake if you are breastfeeding.  Be aware of how much alcohol is in your drink. In the [place] one drink equals one 12 oz bottle of beer (355 mL), one 5 oz glass of wine (148 mL), or one 1 oz glass of hard liquor (44 mL). General instructions  Schedule regular health, dental, and eye exams.  Stay current with your vaccines.  Tell your health care provider if: ? You often feel depressed. ? You have ever been abused or do not feel safe at home. Summary  Adopting a healthy lifestyle and getting preventive care are important in promoting health and   Follow your health care provider's instructions about healthy diet, exercising, and getting tested or screened for diseases.  Follow your health care provider's instructions on monitoring your cholesterol and blood pressure. This information is not intended to replace advice given to you by your health care provider. Make sure you discuss any questions you have with your health care provider. Document Revised: 12/05/2018 Document Reviewed: 12/05/2018 Elsevier Patient Education  2021 Elsevier Inc.  

## 2021-01-06 NOTE — Telephone Encounter (Signed)
Pt called and stated the Rx was sent to the wrong pharmacy medications need to be sent to:  [place], [place]

## 2021-01-06 NOTE — Progress Notes (Signed)
Renee Watson 65 y.o.   Chief Complaint  Patient presents with  . Annual Exam  . Medication Refill    Pend    HISTORY OF PRESENT ILLNESS: This is a 65 y.o. female here for annual exam and medication refill. Has history of hypothyroidism on Synthroid 50 mcg daily. Also has history of anxiety disorder on Celexa 10 mg daily. Self health grade: A or B. Has no complaints or medical concerns today, however going through high stress period at present time. Fully vaccinated against COVID with a booster. A1A  HPI   Prior to Admission medications   Medication Sig Start Date End Date Taking? Authorizing Provider  citalopram (CELEXA) 10 MG tablet Take 1 tablet (10 mg total) by mouth daily. 01/06/21   Horald Pollen, MD  levothyroxine (SYNTHROID) 50 MCG tablet Take 1 tablet (50 mcg total) by mouth daily before breakfast. 01/06/21   Horald Pollen, MD    No Known Allergies  Patient Active Problem List   Diagnosis Date Noted  . Adjustment disorder with anxious mood 01/18/2019  . Elevated alkaline phosphatase level 03/03/2015  . Underweight 01/10/2015  . Other specified hypothyroidism 01/02/2014    Past Medical History:  Diagnosis Date  . Hyperbilirubinemia   . Thyroid disease     History reviewed. No pertinent surgical history.  Social History   Socioeconomic History  . Marital status: Divorced    Spouse name: Not on file  . Number of children: Not on file  . Years of education: Not on file  . Highest education level: Not on file  Occupational History  . Not on file  Tobacco Use  . Smoking status: Never Smoker  . Smokeless tobacco: Never Used  Substance and Sexual Activity  . Alcohol use: Yes    Alcohol/week: 0.0 standard drinks  . Drug use: No  . Sexual activity: Not on file  Other Topics Concern  . Not on file  Social History Narrative  . Not on file   Social Determinants of Health   Financial Resource Strain: Not on file  Food Insecurity:  Not on file  Transportation Needs: Not on file  Physical Activity: Not on file  Stress: Not on file  Social Connections: Not on file  Intimate Partner Violence: Not on file    Family History  Problem Relation Age of Onset  . Hyperlipidemia Mother   . Diabetes Mother      Review of Systems  Constitutional: Negative.  Negative for chills and fever.  HENT: Negative.  Negative for congestion and sore throat.   Respiratory: Negative.  Negative for cough and shortness of breath.   Cardiovascular: Negative.  Negative for chest pain and palpitations.  Gastrointestinal: Negative.  Negative for abdominal pain, blood in stool, diarrhea, melena, nausea and vomiting.  Genitourinary: Negative.  Negative for dysuria and hematuria.  Musculoskeletal: Negative.  Negative for joint pain, myalgias and neck pain.  Skin: Negative.  Negative for rash.  Neurological: Negative.  Negative for dizziness and headaches.  All other systems reviewed and are negative.  Today's Vitals   01/06/21 0804  BP: 128/79  Pulse: 81  Resp: 16  Temp: 98 F (36.7 C)  TempSrc: Temporal  SpO2: 98%  Weight: 105 lb (47.6 kg)  Height: 5\' 5"  (1.651 m)   Body mass index is 17.47 kg/m.   Physical Exam Vitals reviewed.  Constitutional:      Appearance: Normal appearance.  HENT:     Head: Normocephalic.  Mouth/Throat:     Mouth: Mucous membranes are moist.     Pharynx: Oropharynx is clear.  Eyes:     Extraocular Movements: Extraocular movements intact.     Conjunctiva/sclera: Conjunctivae normal.     Pupils: Pupils are equal, round, and reactive to light.  Neck:     Vascular: No carotid bruit.  Cardiovascular:     Rate and Rhythm: Normal rate and regular rhythm.     Pulses: Normal pulses.     Heart sounds: Normal heart sounds.  Pulmonary:     Effort: Pulmonary effort is normal.     Breath sounds: Normal breath sounds.  Abdominal:     General: Bowel sounds are normal. There is no distension.      Palpations: Abdomen is soft. There is no mass.     Tenderness: There is no abdominal tenderness.  Musculoskeletal:        General: Normal range of motion.     Cervical back: Normal range of motion and neck supple. No tenderness.  Lymphadenopathy:     Cervical: No cervical adenopathy.  Skin:    General: Skin is warm and dry.     Capillary Refill: Capillary refill takes less than 2 seconds.  Neurological:     General: No focal deficit present.     Mental Status: She is oriented to person, place, and time.  Psychiatric:        Mood and Affect: Mood normal.        Behavior: Behavior normal.      ASSESSMENT & PLAN: Mickey was seen today for annual exam and medication refill.  Diagnoses and all orders for this visit:  Routine general medical examination at a health care facility  Adjustment disorder with anxious mood -     citalopram (CELEXA) 10 MG tablet; Take 1 tablet (10 mg total) by mouth daily.  Hypothyroidism, unspecified type -     levothyroxine (SYNTHROID) 50 MCG tablet; Take 1 tablet (50 mcg total) by mouth daily before breakfast. -     Thyroid Panel With TSH  Visit for screening mammogram -     Mammogram Digital Screening; Future  Colon cancer screening -     Ambulatory referral to Gastroenterology  Screening for deficiency anemia -     CBC with Differential/Platelet  Screening for lipoid disorders -     Lipid panel  Screening for endocrine, metabolic and immunity disorder -     Comprehensive metabolic panel -     Hemoglobin A1c  Need for vaccination with 13-polyvalent pneumococcal conjugate vaccine -     Pneumococcal conjugate vaccine 13-valent IM    Patient Instructions       If you have lab work done today you will be contacted with your lab results within the next 2 weeks.  If you have not heard from [place] then please contact us. The fastest way to get your results is to register for My Chart.   IF you received an x-ray today, you will receive an  invoice from [facility]. Please contact [facility] at [phone] with questions or concerns regarding your invoice.   IF you received labwork today, you will receive an invoice from [facility]. Please contact [facility] at [phone] with questions or concerns regarding your invoice.   Our billing staff will not be able to assist you with questions regarding bills from these companies.  You will be contacted with the lab results as soon as they are available. The fastest way to get your  results is to activate your My Chart account. Instructions are located on the last page of this paperwork. If you have not heard from [place] regarding the results in 2 weeks, please contact this office.      Health Maintenance, Female Adopting a healthy lifestyle and getting preventive care are important in promoting health and wellness. Ask your health care provider about:  The right schedule for you to have regular tests and exams.  Things you can do on your own to prevent diseases and keep yourself healthy. What should I know about diet, weight, and exercise? Eat a healthy diet  Eat a diet that includes plenty of vegetables, fruits, low-fat dairy products, and lean protein.  Do not eat a lot of foods that are high in solid fats, added sugars, or sodium.   Maintain a healthy weight Body mass index (BMI) is used to identify weight problems. It estimates body fat based on height and weight. Your health care provider can help determine your BMI and help you achieve or maintain a healthy weight. Get regular exercise Get regular exercise. This is one of the most important things you can do for your health. Most adults should:  Exercise for at least 150 minutes each week. The exercise should increase your heart rate and make you sweat (moderate-intensity exercise).  Do strengthening exercises at least twice a week. This is in addition to the moderate-intensity exercise.  Spend less time  sitting. Even light physical activity can be beneficial. Watch cholesterol and blood lipids Have your blood tested for lipids and cholesterol at 66 years of age, then have this test every 5 years. Have your cholesterol levels checked more often if:  Your lipid or cholesterol levels are high.  You are older than 66 years of age.  You are at high risk for heart disease. What should I know about cancer screening? Depending on your health history and family history, you may need to have cancer screening at various ages. This may include screening for:  Breast cancer.  Cervical cancer.  Colorectal cancer.  Skin cancer.  Lung cancer. What should I know about heart disease, diabetes, and high blood pressure? Blood pressure and heart disease  High blood pressure causes heart disease and increases the risk of stroke. This is more likely to develop in people who have high blood pressure readings, are of African descent, or are overweight.  Have your blood pressure checked: ? Every 3-5 years if you are 32-99 years of age. ? Every year if you are 24 years old or older. Diabetes Have regular diabetes screenings. This checks your fasting blood sugar level. Have the screening done:  Once every three years after age 57 if you are at a normal weight and have a low risk for diabetes.  More often and at a younger age if you are overweight or have a high risk for diabetes. What should I know about preventing infection? Hepatitis B If you have a higher risk for hepatitis B, you should be screened for this virus. Talk with your health care provider to find out if you are at risk for hepatitis B infection. Hepatitis C Testing is recommended for:  Everyone born from [birth year] through 1965.  Anyone with known risk factors for hepatitis C. Sexually transmitted infections (STIs)  Get screened for STIs, including gonorrhea and chlamydia, if: ? You are sexually active and are younger than 66 years of  age. ? You are older than 66 years of age and your  health care provider tells you that you are at risk for this type of infection. ? Your sexual activity has changed since you were last screened, and you are at increased risk for chlamydia or gonorrhea. Ask your health care provider if you are at risk.  Ask your health care provider about whether you are at high risk for HIV. Your health care provider may recommend a prescription medicine to help prevent HIV infection. If you choose to take medicine to prevent HIV, you should first get tested for HIV. You should then be tested every 3 months for as long as you are taking the medicine. Pregnancy  If you are about to stop having your period (premenopausal) and you may become pregnant, seek counseling before you get pregnant.  Take 400 to 800 micrograms (mcg) of folic acid every day if you become pregnant.  Ask for birth control (contraception) if you want to prevent pregnancy. Osteoporosis and menopause Osteoporosis is a disease in which the bones lose minerals and strength with aging. This can result in bone fractures. If you are 3 years old or older, or if you are at risk for osteoporosis and fractures, ask your health care provider if you should:  Be screened for bone loss.  Take a calcium or vitamin D supplement to lower your risk of fractures.  Be given hormone replacement therapy (HRT) to treat symptoms of menopause. Follow these instructions at home: Lifestyle  Do not use any products that contain nicotine or tobacco, such as cigarettes, e-cigarettes, and chewing tobacco. If you need help quitting, ask your health care provider.  Do not use street drugs.  Do not share needles.  Ask your health care provider for help if you need support or information about quitting drugs. Alcohol use  Do not drink alcohol if: ? Your health care provider tells you not to drink. ? You are pregnant, may be pregnant, or are planning to become  pregnant.  If you drink alcohol: ? Limit how much you use to 0-1 drink a day. ? Limit intake if you are breastfeeding.  Be aware of how much alcohol is in your drink. In the [place] one drink equals one 12 oz bottle of beer (355 mL), one 5 oz glass of wine (148 mL), or one 1 oz glass of hard liquor (44 mL). General instructions  Schedule regular health, dental, and eye exams.  Stay current with your vaccines.  Tell your health care provider if: ? You often feel depressed. ? You have ever been abused or do not feel safe at home. Summary  Adopting a healthy lifestyle and getting preventive care are important in promoting health and wellness.  Follow your health care provider's instructions about healthy diet, exercising, and getting tested or screened for diseases.  Follow your health care provider's instructions on monitoring your cholesterol and blood pressure. This information is not intended to replace advice given to you by your health care provider. Make sure you discuss any questions you have with your health care provider. Document Revised: 12/05/2018 Document Reviewed: 12/05/2018 Elsevier Patient Education  2021 Elsevier Inc.      Agustina Caroli, MD [facility]

## 2021-01-07 ENCOUNTER — Other Ambulatory Visit: Payer: Self-pay

## 2021-01-07 DIAGNOSIS — F4322 Adjustment disorder with anxiety: Secondary | ICD-10-CM

## 2021-01-07 DIAGNOSIS — E039 Hypothyroidism, unspecified: Secondary | ICD-10-CM

## 2021-01-07 LAB — LIPID PANEL
Chol/HDL Ratio: 2.5 ratio (ref 0.0–4.4)
Cholesterol, Total: 206 mg/dL — ABNORMAL HIGH (ref 100–199)
HDL: 82 mg/dL (ref >39)
LDL Chol Calc (NIH): 111 mg/dL — ABNORMAL HIGH (ref 0–99)
Triglycerides: 70 mg/dL (ref 0–149)
VLDL Cholesterol Cal: 13 mg/dL (ref 5–40)

## 2021-01-07 LAB — COMPREHENSIVE METABOLIC PANEL
ALT: 34 IU/L — ABNORMAL HIGH (ref 0–32)
AST: 40 IU/L (ref 0–40)
Albumin/Globulin Ratio: 1.7 (ref 1.2–2.2)
Albumin: 4.2 g/dL (ref 3.8–4.8)
Alkaline Phosphatase: 125 IU/L — ABNORMAL HIGH (ref 44–121)
BUN/Creatinine Ratio: 23 (ref 12–28)
BUN: 14 mg/dL (ref 8–27)
Bilirubin Total: 0.8 mg/dL (ref 0.0–1.2)
CO2: 21 mmol/L (ref 20–29)
Calcium: 9.1 mg/dL (ref 8.7–10.3)
Chloride: 103 mmol/L (ref 96–106)
Creatinine, Ser: 0.6 mg/dL (ref 0.57–1.00)
GFR calc Af Amer: 111 mL/min/{1.73_m2} (ref >59)
GFR calc non Af Amer: 96 mL/min/{1.73_m2} (ref >59)
Globulin, Total: 2.5 g/dL (ref 1.5–4.5)
Glucose: 83 mg/dL (ref 65–99)
Potassium: 4.2 mmol/L (ref 3.5–5.2)
Sodium: 138 mmol/L (ref 134–144)
Total Protein: 6.7 g/dL (ref 6.0–8.5)

## 2021-01-07 LAB — CBC WITH DIFFERENTIAL/PLATELET
Basophils Absolute: 0.1 10*3/uL (ref 0.0–0.2)
Basos: 1 %
EOS (ABSOLUTE): 0.1 10*3/uL (ref 0.0–0.4)
Eos: 2 %
Hematocrit: 40.3 % (ref 34.0–46.6)
Hemoglobin: 13.9 g/dL (ref 11.1–15.9)
Immature Grans (Abs): 0 10*3/uL (ref 0.0–0.1)
Immature Granulocytes: 0 %
Lymphocytes Absolute: 1.3 10*3/uL (ref 0.7–3.1)
Lymphs: 28 %
MCH: 29.6 pg (ref 26.6–33.0)
MCHC: 34.5 g/dL (ref 31.5–35.7)
MCV: 86 fL (ref 79–97)
Monocytes Absolute: 0.5 10*3/uL (ref 0.1–0.9)
Monocytes: 10 %
Neutrophils Absolute: 2.9 10*3/uL (ref 1.4–7.0)
Neutrophils: 59 %
Platelets: 289 10*3/uL (ref 150–450)
RBC: 4.7 x10E6/uL (ref 3.77–5.28)
RDW: 12.4 % (ref 11.7–15.4)
WBC: 4.8 10*3/uL (ref 3.4–10.8)

## 2021-01-07 LAB — HEMOGLOBIN A1C
Est. average glucose Bld gHb Est-mCnc: 100 mg/dL
Hgb A1c MFr Bld: 5.1 % (ref 4.8–5.6)

## 2021-01-07 LAB — THYROID PANEL WITH TSH
Free Thyroxine Index: 2.7 (ref 1.2–4.9)
T3 Uptake Ratio: 31 % (ref 24–39)
T4, Total: 8.7 ug/dL (ref 4.5–12.0)
TSH: 2.59 u[IU]/mL (ref 0.450–4.500)

## 2021-01-07 MED ORDER — CITALOPRAM HYDROBROMIDE 10 MG PO TABS: 10.0000 mg | ORAL_TABLET | Freq: Every day | ORAL | 3 refills | Status: DC

## 2021-01-07 MED ORDER — LEVOTHYROXINE SODIUM 50 MCG PO TABS: 50.0000 ug | ORAL_TABLET | Freq: Every day | ORAL | 3 refills | Status: DC

## 2021-01-07 NOTE — Telephone Encounter (Signed)
These were sent and other pharmacies have been removed

## 2021-01-07 NOTE — Telephone Encounter (Signed)
Pt is calling again stating that she still hasn't received the medication to the [facility].  The [place] needs to be taken off pts chart.

## 2022-01-10 ENCOUNTER — Encounter: Payer: Self-pay | Admitting: Emergency Medicine

## 2022-01-17 ENCOUNTER — Encounter: Payer: Self-pay | Admitting: Emergency Medicine

## 2022-01-17 ENCOUNTER — Other Ambulatory Visit: Payer: Self-pay

## 2022-01-17 ENCOUNTER — Ambulatory Visit (INDEPENDENT_AMBULATORY_CARE_PROVIDER_SITE_OTHER): Payer: BC Managed Care – PPO | Admitting: Emergency Medicine

## 2022-01-17 VITALS — BP 106/60 | HR 84 | Temp 98.0°F | Ht 65.0 in | Wt 105.0 lb

## 2022-01-17 DIAGNOSIS — F4322 Adjustment disorder with anxiety: Secondary | ICD-10-CM | POA: Diagnosis not present

## 2022-01-17 DIAGNOSIS — E038 Other specified hypothyroidism: Secondary | ICD-10-CM

## 2022-01-17 DIAGNOSIS — Z13220 Encounter for screening for lipoid disorders: Secondary | ICD-10-CM | POA: Diagnosis not present

## 2022-01-17 DIAGNOSIS — Z1329 Encounter for screening for other suspected endocrine disorder: Secondary | ICD-10-CM | POA: Diagnosis not present

## 2022-01-17 DIAGNOSIS — Z13820 Encounter for screening for osteoporosis: Secondary | ICD-10-CM

## 2022-01-17 DIAGNOSIS — Z13228 Encounter for screening for other metabolic disorders: Secondary | ICD-10-CM

## 2022-01-17 DIAGNOSIS — Z1231 Encounter for screening mammogram for malignant neoplasm of breast: Secondary | ICD-10-CM

## 2022-01-17 DIAGNOSIS — Z130 Encounter for screening for diseases of the blood and blood-forming organs and certain disorders involving the immune mechanism: Secondary | ICD-10-CM | POA: Diagnosis not present

## 2022-01-17 DIAGNOSIS — E039 Hypothyroidism, unspecified: Secondary | ICD-10-CM

## 2022-01-17 DIAGNOSIS — Z0000 Encounter for general adult medical examination without abnormal findings: Secondary | ICD-10-CM

## 2022-01-17 DIAGNOSIS — Z1211 Encounter for screening for malignant neoplasm of colon: Secondary | ICD-10-CM

## 2022-01-17 LAB — CBC WITH DIFFERENTIAL/PLATELET
Basophils Absolute: 0 10*3/uL (ref 0.0–0.1)
Basophils Relative: 0.6 % (ref 0.0–3.0)
Eosinophils Absolute: 0.1 10*3/uL (ref 0.0–0.7)
Eosinophils Relative: 1.2 % (ref 0.0–5.0)
HCT: 44.6 % (ref 36.0–46.0)
Hemoglobin: 14.6 g/dL (ref 12.0–15.0)
Lymphocytes Relative: 20.4 % (ref 12.0–46.0)
Lymphs Abs: 1.1 10*3/uL (ref 0.7–4.0)
MCHC: 32.8 g/dL (ref 30.0–36.0)
MCV: 90.4 fl (ref 78.0–100.0)
Monocytes Absolute: 0.5 10*3/uL (ref 0.1–1.0)
Monocytes Relative: 8.2 % (ref 3.0–12.0)
Neutro Abs: 3.9 10*3/uL (ref 1.4–7.7)
Neutrophils Relative %: 69.6 % (ref 43.0–77.0)
Platelets: 284 10*3/uL (ref 150.0–400.0)
RBC: 4.93 Mil/uL (ref 3.87–5.11)
RDW: 13.4 % (ref 11.5–15.5)
WBC: 5.6 10*3/uL (ref 4.0–10.5)

## 2022-01-17 LAB — LIPID PANEL
Cholesterol: 219 mg/dL — ABNORMAL HIGH (ref 0–200)
HDL: 86.1 mg/dL (ref >39.00)
LDL Cholesterol: 122 mg/dL — ABNORMAL HIGH (ref 0–99)
NonHDL: 132.99
Total CHOL/HDL Ratio: 3
Triglycerides: 56 mg/dL (ref 0.0–149.0)
VLDL: 11.2 mg/dL (ref 0.0–40.0)

## 2022-01-17 LAB — COMPREHENSIVE METABOLIC PANEL
ALT: 30 U/L (ref 0–35)
AST: 31 U/L (ref 0–37)
Albumin: 4.5 g/dL (ref 3.5–5.2)
Alkaline Phosphatase: 113 U/L (ref 39–117)
BUN: 16 mg/dL (ref 6–23)
CO2: 30 mEq/L (ref 19–32)
Calcium: 9.6 mg/dL (ref 8.4–10.5)
Chloride: 101 mEq/L (ref 96–112)
Creatinine, Ser: 0.61 mg/dL (ref 0.40–1.20)
GFR: 93.24 mL/min (ref >60.00)
Glucose, Bld: 87 mg/dL (ref 70–99)
Potassium: 4.1 mEq/L (ref 3.5–5.1)
Sodium: 137 mEq/L (ref 135–145)
Total Bilirubin: 1.2 mg/dL (ref 0.2–1.2)
Total Protein: 7.4 g/dL (ref 6.0–8.3)

## 2022-01-17 LAB — TSH: TSH: 2.97 u[IU]/mL (ref 0.35–5.50)

## 2022-01-17 LAB — HEMOGLOBIN A1C: Hgb A1c MFr Bld: 5.2 % (ref 4.6–6.5)

## 2022-01-17 MED ORDER — CITALOPRAM HYDROBROMIDE 10 MG PO TABS: 10.0000 mg | ORAL_TABLET | Freq: Every day | ORAL | 3 refills | Status: DC

## 2022-01-17 NOTE — Progress Notes (Signed)
Renee Watson 66 y.o.   Chief Complaint  Patient presents with   Annual Exam    Medication refill    HISTORY OF PRESENT ILLNESS: This is a 66 y.o. female here for annual exam.  Also medication refill. #1 history of hypothyroidism.  However last October started having symptoms of hyperthyroidism such as hair loss, heart racing, and daily sweats.  Reduced dose of Synthroid to 25 mcg and feels better. #2 history of depression: On Celexa 10 mg daily.  Helping a lot. Presently dealing with stress of son moving to [place].  Recent news creating anxiety and stress. Needs referrals for mammogram and colonoscopy.  Also needs DEXA scan for osteoporosis screening. No other complaints or medical concerns today.  HPI   Prior to Admission medications   Medication Sig Start Date End Date Taking? Authorizing Provider  levothyroxine (SYNTHROID) 50 MCG tablet Take 1 tablet by mouth daily. 01/18/13  Yes [provider]  citalopram (CELEXA) 10 MG tablet Take 1 tablet (10 mg total) by mouth daily. 01/07/21   Horald Pollen, MD  levothyroxine (SYNTHROID) 50 MCG tablet Take 1 tablet (50 mcg total) by mouth daily before breakfast. 01/07/21   Horald Pollen, MD    No Known Allergies  Patient Active Problem List   Diagnosis Date Noted   Adjustment disorder with anxious mood 01/18/2019   Elevated alkaline phosphatase level 03/03/2015   Underweight 01/10/2015   Other specified hypothyroidism 01/02/2014    Past Medical History:  Diagnosis Date   Hyperbilirubinemia    Thyroid disease     No past surgical history on file.  Social History   Socioeconomic History   Marital status: Divorced    Spouse name: Not on file   Number of children: Not on file   Years of education: Not on file   Highest education level: Not on file  Occupational History   Not on file  Tobacco Use   Smoking status: Never   Smokeless tobacco: Never  Substance and Sexual Activity   Alcohol use:  Yes    Alcohol/week: 0.0 standard drinks   Drug use: No   Sexual activity: Not on file  Other Topics Concern   Not on file  Social History Narrative   Not on file   Social Determinants of Health   Financial Resource Strain: Not on file  Food Insecurity: Not on file  Transportation Needs: Not on file  Physical Activity: Not on file  Stress: Not on file  Social Connections: Not on file  Intimate Partner Violence: Not on file    Family History  Problem Relation Age of Onset   Hyperlipidemia Mother    Diabetes Mother      Review of Systems  Constitutional: Negative.  Negative for chills and fever.  HENT: Negative.  Negative for congestion and sore throat.   Respiratory: Negative.  Negative for cough and shortness of breath.   Cardiovascular: Negative.  Negative for chest pain and palpitations.  Gastrointestinal: Negative.  Negative for abdominal pain, diarrhea, nausea and vomiting.  Genitourinary: Negative.  Negative for dysuria and hematuria.  Skin: Negative.  Negative for rash.  Neurological: Negative.  Negative for dizziness and headaches.  All other systems reviewed and are negative.   Physical Exam Vitals reviewed.  Constitutional:      Appearance: Normal appearance.  HENT:     Head: Normocephalic.     Mouth/Throat:     Mouth: Mucous membranes are moist.     Pharynx: Oropharynx is  clear.  Eyes:     Extraocular Movements: Extraocular movements intact.     Conjunctiva/sclera: Conjunctivae normal.     Pupils: Pupils are equal, round, and reactive to light.  Cardiovascular:     Rate and Rhythm: Normal rate and regular rhythm.     Pulses: Normal pulses.     Heart sounds: Normal heart sounds.  Pulmonary:     Effort: Pulmonary effort is normal.     Breath sounds: Normal breath sounds.  Abdominal:     General: There is no distension.     Palpations: Abdomen is soft. There is no mass.     Tenderness: There is no abdominal tenderness.  Musculoskeletal:      Cervical back: Normal range of motion and neck supple.  Skin:    Capillary Refill: Capillary refill takes less than 2 seconds.  Neurological:     General: No focal deficit present.     Mental Status: She is alert and oriented to person, place, and time.  Psychiatric:        Mood and Affect: Mood normal.        Behavior: Behavior normal.     ASSESSMENT & PLAN: Problem List Items Addressed This Visit       Endocrine   Other specified hypothyroidism   Relevant Orders   TSH     Other   Adjustment disorder with anxious mood   Relevant Medications   citalopram (CELEXA) 10 MG tablet   Other Visit Diagnoses     Routine general medical examination at a health care facility    -  Primary   Osteoporosis screening       Relevant Orders   [place]   Visit for screening mammogram       Relevant Orders   Mammogram Digital Screening   Colon cancer screening       Relevant Orders   Ambulatory referral to Gastroenterology   Screening for deficiency anemia       Relevant Orders   CBC with Differential   Screening for lipoid disorders       Relevant Orders   Lipid panel   Screening for endocrine, metabolic and immunity disorder       Relevant Orders   Comprehensive metabolic panel   Hemoglobin A1c      Modifiable risk factors discussed with patient. Anticipatory guidance according to age provided. The following topics were also discussed: Social Determinants of Health Smoking.  Non-smoker Diet and nutrition Benefits of exercise Cancer screening and need for colonoscopy and or mammogram.  Also needs osteoporosis screening with DEXA scan Vaccinations recommendations Cardiovascular risk assessment Hypothyroidism on management.  Continue 25 mcg of Synthroid.  TSH done today. Mental health including depression and anxiety.  Need to continue Celexa 10 mg daily. Fall and accident prevention  Patient Instructions  Health Maintenance, Female Adopting a healthy lifestyle and  getting preventive care are important in promoting health and wellness. Ask your health care provider about: The right schedule for you to have regular tests and exams. Things you can do on your own to prevent diseases and keep yourself healthy. What should I know about diet, weight, and exercise? Eat a healthy diet  Eat a diet that includes plenty of vegetables, fruits, low-fat dairy products, and lean protein. Do not eat a lot of foods that are high in solid fats, added sugars, or sodium. Maintain a healthy weight Body mass index (BMI) is used to identify weight problems. It estimates body fat based on  height and weight. Your health care provider can help determine your BMI and help you achieve or maintain a healthy weight. Get regular exercise Get regular exercise. This is one of the most important things you can do for your health. Most adults should: Exercise for at least 150 minutes each week. The exercise should increase your heart rate and make you sweat (moderate-intensity exercise). Do strengthening exercises at least twice a week. This is in addition to the moderate-intensity exercise. Spend less time sitting. Even light physical activity can be beneficial. Watch cholesterol and blood lipids Have your blood tested for lipids and cholesterol at 67 years of age, then have this test every 5 years. Have your cholesterol levels checked more often if: Your lipid or cholesterol levels are high. You are older than 67 years of age. You are at high risk for heart disease. What should I know about cancer screening? Depending on your health history and family history, you may need to have cancer screening at various ages. This may include screening for: Breast cancer. Cervical cancer. Colorectal cancer. Skin cancer. Lung cancer. What should I know about heart disease, diabetes, and high blood pressure? Blood pressure and heart disease High blood pressure causes heart disease and  increases the risk of stroke. This is more likely to develop in people who have high blood pressure readings or are overweight. Have your blood pressure checked: Every 3-5 years if you are 59-90 years of age. Every year if you are 67 years old or older. Diabetes Have regular diabetes screenings. This checks your fasting blood sugar level. Have the screening done: Once every three years after age 29 if you are at a normal weight and have a low risk for diabetes. More often and at a younger age if you are overweight or have a high risk for diabetes. What should I know about preventing infection? Hepatitis B If you have a higher risk for hepatitis B, you should be screened for this virus. Talk with your health care provider to find out if you are at risk for hepatitis B infection. Hepatitis C Testing is recommended for: Everyone born from [birth year] through 1965. Anyone with known risk factors for hepatitis C. Sexually transmitted infections (STIs) Get screened for STIs, including gonorrhea and chlamydia, if: You are sexually active and are younger than 67 years of age. You are older than 67 years of age and your health care provider tells you that you are at risk for this type of infection. Your sexual activity has changed since you were last screened, and you are at increased risk for chlamydia or gonorrhea. Ask your health care provider if you are at risk. Ask your health care provider about whether you are at high risk for HIV. Your health care provider may recommend a prescription medicine to help prevent HIV infection. If you choose to take medicine to prevent HIV, you should first get tested for HIV. You should then be tested every 3 months for as long as you are taking the medicine. Pregnancy If you are about to stop having your period (premenopausal) and you may become pregnant, seek counseling before you get pregnant. Take 400 to 800 micrograms (mcg) of folic acid every day if you become  pregnant. Ask for birth control (contraception) if you want to prevent pregnancy. Osteoporosis and menopause Osteoporosis is a disease in which the bones lose minerals and strength with aging. This can result in bone fractures. If you are 37 years old or older, or  if you are at risk for osteoporosis and fractures, ask your health care provider if you should: Be screened for bone loss. Take a calcium or vitamin D supplement to lower your risk of fractures. Be given hormone replacement therapy (HRT) to treat symptoms of menopause. Follow these instructions at home: Alcohol use Do not drink alcohol if: Your health care provider tells you not to drink. You are pregnant, may be pregnant, or are planning to become pregnant. If you drink alcohol: Limit how much you have to: 0-1 drink a day. Know how much alcohol is in your drink. In the [place] one drink equals one 12 oz bottle of beer (355 mL), one 5 oz glass of wine (148 mL), or one 1 oz glass of hard liquor (44 mL). Lifestyle Do not use any products that contain nicotine or tobacco. These products include cigarettes, chewing tobacco, and vaping devices, such as e-cigarettes. If you need help quitting, ask your health care provider. Do not use street drugs. Do not share needles. Ask your health care provider for help if you need support or information about quitting drugs. General instructions Schedule regular health, dental, and eye exams. Stay current with your vaccines. Tell your health care provider if: You often feel depressed. You have ever been abused or do not feel safe at home. Summary Adopting a healthy lifestyle and getting preventive care are important in promoting health and wellness. Follow your health care provider's instructions about healthy diet, exercising, and getting tested or screened for diseases. Follow your health care provider's instructions on monitoring your cholesterol and blood pressure. This information is not  intended to replace advice given to you by your health care provider. Make sure you discuss any questions you have with your health care provider. Document Revised: 05/03/2021 Document Reviewed: 05/03/2021 Elsevier Patient Education  2022 [place], MD [facility]

## 2022-01-17 NOTE — Patient Instructions (Signed)

## 2022-01-18 ENCOUNTER — Other Ambulatory Visit: Payer: Self-pay | Admitting: Emergency Medicine

## 2022-01-18 DIAGNOSIS — E038 Other specified hypothyroidism: Secondary | ICD-10-CM

## 2022-01-18 MED ORDER — LEVOTHYROXINE SODIUM 25 MCG PO TABS: 25.0000 ug | ORAL_TABLET | Freq: Every day | ORAL | 3 refills | Status: DC

## 2022-11-10 DIAGNOSIS — H0012 Chalazion right lower eyelid: Secondary | ICD-10-CM | POA: Diagnosis not present

## 2022-11-10 DIAGNOSIS — H43811 Vitreous degeneration, right eye: Secondary | ICD-10-CM | POA: Diagnosis not present

## 2022-11-10 DIAGNOSIS — H52223 Regular astigmatism, bilateral: Secondary | ICD-10-CM | POA: Diagnosis not present

## 2022-11-10 DIAGNOSIS — H524 Presbyopia: Secondary | ICD-10-CM | POA: Diagnosis not present

## 2022-11-10 DIAGNOSIS — H2513 Age-related nuclear cataract, bilateral: Secondary | ICD-10-CM | POA: Diagnosis not present

## 2022-11-10 DIAGNOSIS — H5213 Myopia, bilateral: Secondary | ICD-10-CM | POA: Diagnosis not present

## 2023-01-16 ENCOUNTER — Encounter: Payer: Self-pay | Admitting: Internal Medicine

## 2023-01-19 ENCOUNTER — Ambulatory Visit (INDEPENDENT_AMBULATORY_CARE_PROVIDER_SITE_OTHER): Payer: Medicare Other | Admitting: Emergency Medicine

## 2023-01-19 ENCOUNTER — Encounter: Payer: Self-pay | Admitting: Emergency Medicine

## 2023-01-19 ENCOUNTER — Other Ambulatory Visit: Payer: Self-pay | Admitting: Emergency Medicine

## 2023-01-19 VITALS — BP 120/76 | HR 83 | Temp 97.9°F | Ht 65.0 in | Wt 110.1 lb

## 2023-01-19 DIAGNOSIS — Z13220 Encounter for screening for lipoid disorders: Secondary | ICD-10-CM

## 2023-01-19 DIAGNOSIS — Z13228 Encounter for screening for other metabolic disorders: Secondary | ICD-10-CM

## 2023-01-19 DIAGNOSIS — Z1211 Encounter for screening for malignant neoplasm of colon: Secondary | ICD-10-CM | POA: Diagnosis not present

## 2023-01-19 DIAGNOSIS — F4322 Adjustment disorder with anxiety: Secondary | ICD-10-CM

## 2023-01-19 DIAGNOSIS — E039 Hypothyroidism, unspecified: Secondary | ICD-10-CM

## 2023-01-19 DIAGNOSIS — Z0000 Encounter for general adult medical examination without abnormal findings: Secondary | ICD-10-CM | POA: Diagnosis not present

## 2023-01-19 DIAGNOSIS — Z1231 Encounter for screening mammogram for malignant neoplasm of breast: Secondary | ICD-10-CM | POA: Diagnosis not present

## 2023-01-19 DIAGNOSIS — Z130 Encounter for screening for diseases of the blood and blood-forming organs and certain disorders involving the immune mechanism: Secondary | ICD-10-CM | POA: Diagnosis not present

## 2023-01-19 DIAGNOSIS — Z1329 Encounter for screening for other suspected endocrine disorder: Secondary | ICD-10-CM | POA: Diagnosis not present

## 2023-01-19 LAB — LIPID PANEL
Cholesterol: 230 mg/dL — ABNORMAL HIGH (ref 0–200)
HDL: 81.5 mg/dL (ref >39.00)
LDL Cholesterol: 130 mg/dL — ABNORMAL HIGH (ref 0–99)
NonHDL: 148.16
Total CHOL/HDL Ratio: 3
Triglycerides: 92 mg/dL (ref 0.0–149.0)
VLDL: 18.4 mg/dL (ref 0.0–40.0)

## 2023-01-19 LAB — CBC WITH DIFFERENTIAL/PLATELET
Basophils Absolute: 0.1 10*3/uL (ref 0.0–0.1)
Basophils Relative: 1.1 % (ref 0.0–3.0)
Eosinophils Absolute: 0.1 10*3/uL (ref 0.0–0.7)
Eosinophils Relative: 2.7 % (ref 0.0–5.0)
HCT: 42.8 % (ref 36.0–46.0)
Hemoglobin: 14.6 g/dL (ref 12.0–15.0)
Lymphocytes Relative: 35 % (ref 12.0–46.0)
Lymphs Abs: 1.6 10*3/uL (ref 0.7–4.0)
MCHC: 34.1 g/dL (ref 30.0–36.0)
MCV: 89.4 fl (ref 78.0–100.0)
Monocytes Absolute: 0.4 10*3/uL (ref 0.1–1.0)
Monocytes Relative: 9.5 % (ref 3.0–12.0)
Neutro Abs: 2.4 10*3/uL (ref 1.4–7.7)
Neutrophils Relative %: 51.7 % (ref 43.0–77.0)
Platelets: 293 10*3/uL (ref 150.0–400.0)
RBC: 4.79 Mil/uL (ref 3.87–5.11)
RDW: 12.8 % (ref 11.5–15.5)
WBC: 4.7 10*3/uL (ref 4.0–10.5)

## 2023-01-19 LAB — COMPREHENSIVE METABOLIC PANEL
ALT: 38 U/L — ABNORMAL HIGH (ref 0–35)
AST: 38 U/L — ABNORMAL HIGH (ref 0–37)
Albumin: 4.5 g/dL (ref 3.5–5.2)
Alkaline Phosphatase: 128 U/L — ABNORMAL HIGH (ref 39–117)
BUN: 19 mg/dL (ref 6–23)
CO2: 30 mEq/L (ref 19–32)
Calcium: 9.6 mg/dL (ref 8.4–10.5)
Chloride: 101 mEq/L (ref 96–112)
Creatinine, Ser: 0.55 mg/dL (ref 0.40–1.20)
GFR: 94.93 mL/min (ref >60.00)
Glucose, Bld: 91 mg/dL (ref 70–99)
Potassium: 4.3 mEq/L (ref 3.5–5.1)
Sodium: 139 mEq/L (ref 135–145)
Total Bilirubin: 1.1 mg/dL (ref 0.2–1.2)
Total Protein: 7.6 g/dL (ref 6.0–8.3)

## 2023-01-19 LAB — HEMOGLOBIN A1C: Hgb A1c MFr Bld: 5.4 % (ref 4.6–6.5)

## 2023-01-19 NOTE — Progress Notes (Signed)
Renee Watson 67 y.o.   Chief Complaint  Patient presents with   Annual Exam    No concerns     HISTORY OF PRESENT ILLNESS: This is a 67 y.o. female here for annual exam. History of hypothyroidism.  On 25 mcg of Synthroid.  Has been trying to take less Still taking Celexa 10 mg daily.  Working well for help Recently retired. Overall doing well.  No complaints or medical concerns today.  HPI   Prior to Admission medications   Medication Sig Start Date End Date Taking? Authorizing Provider  citalopram (CELEXA) 10 MG tablet Take 1 tablet (10 mg total) by mouth daily. 01/17/22   Horald Pollen, MD  levothyroxine (SYNTHROID) 25 MCG tablet Take 1 tablet (25 mcg total) by mouth daily. 01/18/22   Horald Pollen, MD    No Known Allergies  Patient Active Problem List   Diagnosis Date Noted   Adjustment disorder with anxious mood 01/18/2019   Elevated alkaline phosphatase level 03/03/2015   Underweight 01/10/2015   Other specified hypothyroidism 01/02/2014    Past Medical History:  Diagnosis Date   Hyperbilirubinemia    Thyroid disease     No past surgical history on file.  Social History   Socioeconomic History   Marital status: Divorced    Spouse name: Not on file   Number of children: Not on file   Years of education: Not on file   Highest education level: Not on file  Occupational History   Not on file  Tobacco Use   Smoking status: Never   Smokeless tobacco: Never  Substance and Sexual Activity   Alcohol use: Yes    Alcohol/week: 0.0 standard drinks of alcohol   Drug use: No   Sexual activity: Not on file  Other Topics Concern   Not on file  Social History Narrative   Not on file   Social Determinants of Health   Financial Resource Strain: Not on file  Food Insecurity: Not on file  Transportation Needs: Not on file  Physical Activity: Not on file  Stress: Not on file  Social Connections: Not on file  Intimate Partner Violence: Not  on file    Family History  Problem Relation Age of Onset   Hyperlipidemia Mother    Diabetes Mother      Review of Systems  Constitutional: Negative.  Negative for chills and fever.  HENT: Negative.  Negative for congestion and sore throat.   Respiratory: Negative.  Negative for cough and shortness of breath.   Cardiovascular: Negative.  Negative for chest pain and palpitations.  Gastrointestinal: Negative.  Negative for abdominal pain, blood in stool, constipation, nausea and vomiting.  Genitourinary: Negative.  Negative for dysuria and hematuria.  Skin: Negative.  Negative for rash.  Neurological: Negative.  Negative for dizziness and headaches.  All other systems reviewed and are negative.  Today's Vitals   01/19/23 0800  BP: 120/76  Pulse: 83  Temp: 97.9 F (36.6 C)  TempSrc: Oral  SpO2: 95%  Weight: 110 lb 2 oz (50 kg)  Height: '5\' 5"'$  (1.651 m)   Body mass index is 18.33 kg/m.   Physical Exam Vitals reviewed.  Constitutional:      Appearance: Normal appearance.  HENT:     Head: Normocephalic.     Right Ear: Tympanic membrane, ear canal and external ear normal.     Left Ear: Tympanic membrane, ear canal and external ear normal.     Mouth/Throat:  Mouth: Mucous membranes are moist.     Pharynx: Oropharynx is clear.  Eyes:     Extraocular Movements: Extraocular movements intact.     Conjunctiva/sclera: Conjunctivae normal.     Pupils: Pupils are equal, round, and reactive to light.  Cardiovascular:     Rate and Rhythm: Normal rate and regular rhythm.     Pulses: Normal pulses.     Heart sounds: Normal heart sounds.  Pulmonary:     Effort: Pulmonary effort is normal.     Breath sounds: Normal breath sounds.  Abdominal:     Palpations: Abdomen is soft.     Tenderness: There is no abdominal tenderness.  Musculoskeletal:     Cervical back: No tenderness.     Right lower leg: No edema.     Left lower leg: No edema.  Lymphadenopathy:     Cervical: No  cervical adenopathy.  Skin:    General: Skin is warm and dry.  Neurological:     General: No focal deficit present.     Mental Status: She is alert and oriented to person, place, and time.  Psychiatric:        Mood and Affect: Mood normal.        Behavior: Behavior normal.      ASSESSMENT & PLAN: Problem List Items Addressed This Visit   None Visit Diagnoses     Routine general medical examination at a health care facility    -  Primary   Relevant Orders   CBC with Differential   Comprehensive metabolic panel   Hemoglobin A1c   Lipid panel   Hypothyroidism, unspecified type       Relevant Orders   Thyroid Panel With TSH   Visit for screening mammogram       Relevant Orders   Mammogram Digital Screening   Colon cancer screening       Relevant Orders   Ambulatory referral to Gastroenterology   Screening for deficiency anemia       Screening for lipoid disorders       Screening for endocrine, metabolic and immunity disorder          Modifiable risk factors discussed with patient. Anticipatory guidance according to age provided. The following topics were also discussed: Social Determinants of Health Smoking.  Non-smoker Diet and nutrition.  Eating better Benefits of exercise Cancer screening and need for breast and colon cancer screening Vaccinations reviewed and recommendations Cardiovascular risk assessment and need for blood work today. The 10-year ASCVD risk score (Arnett DK, et al., 2019) is: 5.5%   Values used to calculate the score:     Age: 38 years     Sex: Female     Is Non-Hispanic African American: No     Diabetic: No     Tobacco smoker: No     Systolic Blood Pressure: 161 mmHg     Is BP treated: No     HDL Cholesterol: 86.1 mg/dL     Total Cholesterol: 219 mg/dL Review of all medications Mental health including depression and anxiety Fall and accident prevention  Patient Instructions  Health Maintenance, Female Adopting a healthy lifestyle  and getting preventive care are important in promoting health and wellness. Ask your health care provider about: The right schedule for you to have regular tests and exams. Things you can do on your own to prevent diseases and keep yourself healthy. What should I know about diet, weight, and exercise? Eat a healthy diet  Eat a diet  that includes plenty of vegetables, fruits, low-fat dairy products, and lean protein. Do not eat a lot of foods that are high in solid fats, added sugars, or sodium. Maintain a healthy weight Body mass index (BMI) is used to identify weight problems. It estimates body fat based on height and weight. Your health care provider can help determine your BMI and help you achieve or maintain a healthy weight. Get regular exercise Get regular exercise. This is one of the most important things you can do for your health. Most adults should: Exercise for at least 150 minutes each week. The exercise should increase your heart rate and make you sweat (moderate-intensity exercise). Do strengthening exercises at least twice a week. This is in addition to the moderate-intensity exercise. Spend less time sitting. Even light physical activity can be beneficial. Watch cholesterol and blood lipids Have your blood tested for lipids and cholesterol at 68 years of age, then have this test every 5 years. Have your cholesterol levels checked more often if: Your lipid or cholesterol levels are high. You are older than 68 years of age. You are at high risk for heart disease. What should I know about cancer screening? Depending on your health history and family history, you may need to have cancer screening at various ages. This may include screening for: Breast cancer. Cervical cancer. Colorectal cancer. Skin cancer. Lung cancer. What should I know about heart disease, diabetes, and high blood pressure? Blood pressure and heart disease High blood pressure causes heart disease and  increases the risk of stroke. This is more likely to develop in people who have high blood pressure readings or are overweight. Have your blood pressure checked: Every 3-5 years if you are 4-50 years of age. Every year if you are 72 years old or older. Diabetes Have regular diabetes screenings. This checks your fasting blood sugar level. Have the screening done: Once every three years after age 72 if you are at a normal weight and have a low risk for diabetes. More often and at a younger age if you are overweight or have a high risk for diabetes. What should I know about preventing infection? Hepatitis B If you have a higher risk for hepatitis B, you should be screened for this virus. Talk with your health care provider to find out if you are at risk for hepatitis B infection. Hepatitis C Testing is recommended for: Everyone born from [birth year] through 1966. Anyone with known risk factors for hepatitis C. Sexually transmitted infections (STIs) Get screened for STIs, including gonorrhea and chlamydia, if: You are sexually active and are younger than 68 years of age. You are older than 68 years of age and your health care provider tells you that you are at risk for this type of infection. Your sexual activity has changed since you were last screened, and you are at increased risk for chlamydia or gonorrhea. Ask your health care provider if you are at risk. Ask your health care provider about whether you are at high risk for HIV. Your health care provider may recommend a prescription medicine to help prevent HIV infection. If you choose to take medicine to prevent HIV, you should first get tested for HIV. You should then be tested every 3 months for as long as you are taking the medicine. Pregnancy If you are about to stop having your period (premenopausal) and you may become pregnant, seek counseling before you get pregnant. Take 400 to 800 micrograms (mcg) of folic acid every  day if you become  pregnant. Ask for birth control (contraception) if you want to prevent pregnancy. Osteoporosis and menopause Osteoporosis is a disease in which the bones lose minerals and strength with aging. This can result in bone fractures. If you are 17 years old or older, or if you are at risk for osteoporosis and fractures, ask your health care provider if you should: Be screened for bone loss. Take a calcium or vitamin D supplement to lower your risk of fractures. Be given hormone replacement therapy (HRT) to treat symptoms of menopause. Follow these instructions at home: Alcohol use Do not drink alcohol if: Your health care provider tells you not to drink. You are pregnant, may be pregnant, or are planning to become pregnant. If you drink alcohol: Limit how much you have to: 0-1 drink a day. Know how much alcohol is in your drink. In the [place] one drink equals one 12 oz bottle of beer (355 mL), one 5 oz glass of wine (148 mL), or one 1 oz glass of hard liquor (44 mL). Lifestyle Do not use any products that contain nicotine or tobacco. These products include cigarettes, chewing tobacco, and vaping devices, such as e-cigarettes. If you need help quitting, ask your health care provider. Do not use street drugs. Do not share needles. Ask your health care provider for help if you need support or information about quitting drugs. General instructions Schedule regular health, dental, and eye exams. Stay current with your vaccines. Tell your health care provider if: You often feel depressed. You have ever been abused or do not feel safe at home. Summary Adopting a healthy lifestyle and getting preventive care are important in promoting health and wellness. Follow your health care provider's instructions about healthy diet, exercising, and getting tested or screened for diseases. Follow your health care provider's instructions on monitoring your cholesterol and blood pressure. This information is not  intended to replace advice given to you by your health care provider. Make sure you discuss any questions you have with your health care provider. Document Revised: 05/03/2021 Document Reviewed: 05/03/2021 Elsevier Patient Education  [place], MD [facility]

## 2023-01-19 NOTE — Patient Instructions (Signed)

## 2023-01-20 LAB — THYROID PANEL WITH TSH
Free Thyroxine Index: 2.6 (ref 1.4–3.8)
T3 Uptake: 31 % (ref 22–35)
T4, Total: 8.4 ug/dL (ref 5.1–11.9)
TSH: 4.81 mIU/L — ABNORMAL HIGH (ref 0.40–4.50)

## 2023-01-23 ENCOUNTER — Other Ambulatory Visit: Payer: Self-pay | Admitting: Emergency Medicine

## 2023-01-23 DIAGNOSIS — E038 Other specified hypothyroidism: Secondary | ICD-10-CM

## 2023-01-23 MED ORDER — LEVOTHYROXINE SODIUM 25 MCG PO TABS: 25.0000 ug | ORAL_TABLET | Freq: Every day | ORAL | 3 refills | Status: DC

## 2023-01-23 NOTE — Telephone Encounter (Signed)
New prescription for Synthroid 25 mcg sent to pharmacy of record today.  Thanks.

## 2023-01-25 MED ORDER — CITALOPRAM HYDROBROMIDE 10 MG PO TABS: 10.0000 mg | ORAL_TABLET | Freq: Every day | ORAL | 3 refills | Status: DC

## 2023-01-25 NOTE — Telephone Encounter (Signed)
Patient requesting a refill 

## 2023-03-06 DIAGNOSIS — H00015 Hordeolum externum left lower eyelid: Secondary | ICD-10-CM | POA: Diagnosis not present

## 2023-03-06 DIAGNOSIS — B880 Other acariasis: Secondary | ICD-10-CM | POA: Diagnosis not present

## 2023-03-06 DIAGNOSIS — H0102A Squamous blepharitis right eye, upper and lower eyelids: Secondary | ICD-10-CM | POA: Diagnosis not present

## 2023-03-06 DIAGNOSIS — H0102B Squamous blepharitis left eye, upper and lower eyelids: Secondary | ICD-10-CM | POA: Diagnosis not present

## 2023-03-09 ENCOUNTER — Encounter: Payer: Self-pay | Admitting: Internal Medicine

## 2023-03-09 ENCOUNTER — Ambulatory Visit: Payer: Medicare Other | Admitting: Internal Medicine

## 2023-03-09 VITALS — BP 110/70 | HR 95 | Ht 65.0 in | Wt 110.4 lb

## 2023-03-09 DIAGNOSIS — R7989 Other specified abnormal findings of blood chemistry: Secondary | ICD-10-CM

## 2023-03-09 DIAGNOSIS — E039 Hypothyroidism, unspecified: Secondary | ICD-10-CM | POA: Diagnosis not present

## 2023-03-09 DIAGNOSIS — Z1211 Encounter for screening for malignant neoplasm of colon: Secondary | ICD-10-CM

## 2023-03-09 MED ORDER — NA SULFATE-K SULFATE-MG SULF 17.5-3.13-1.6 GM/177ML PO SOLN: ORAL | 0 refills | Status: DC

## 2023-03-09 NOTE — Progress Notes (Signed)
Chief Complaint: Colon cancer screening  HPI : 67 year old with history of Gilbert's disease and hypothyroidism presents to discuss colon cancer screening.  She has never had a colonoscopy in the past. Denies blood in stools, changes in bowel habits, or unintentional weight. Denies diarrhea or constipation. Denies abdominal pain or N&V. Denies dysphagia or acid reflux issues. She stopped drinking alcohol in 12/2022. Dad was an alcoholic and died of cirrhosis. Denies any other family history of GI issues.   Past Medical History:  Diagnosis Date   Hyperbilirubinemia    Thyroid disease    Past Surgical History:  Procedure Laterality Date   NO PAST SURGERIES     Family History  Problem Relation Age of Onset   Hyperlipidemia Mother    Diabetes Mother    Liver disease Father    Alcoholism Father    Colon cancer Neg Hx    Colon polyps Neg Hx    Esophageal cancer Neg Hx    Pancreatic cancer Neg Hx    Stomach cancer Neg Hx    Social History   Tobacco Use   Smoking status: Never   Smokeless tobacco: Never  Vaping Use   Vaping Use: Never used  Substance Use Topics   Alcohol use: Not Currently   Drug use: No   Current Outpatient Medications  Medication Sig Dispense Refill   citalopram (CELEXA) 10 MG tablet Take 1 tablet (10 mg total) by mouth daily. 90 tablet 3   levothyroxine (SYNTHROID) 25 MCG tablet Take 1 tablet (25 mcg total) by mouth daily. 90 tablet 3   XDEMVY 0.25 % SOLN Apply 1 drop to eye 2 (two) times daily.     No current facility-administered medications for this visit.   No Known Allergies  Review of Systems: All systems reviewed and negative except where noted in HPI.   Physical Exam: BP 110/70   Pulse 95   Ht 5\' 5"  (1.651 m)   Wt 110 lb 6 oz (50.1 kg)   SpO2 96%   BMI 18.37 kg/m  Constitutional: Pleasant,well-developed, female in no acute distress. HEENT: Normocephalic and atraumatic. Conjunctivae are normal. No scleral icterus. Cardiovascular:  Normal rate, regular rhythm.  Pulmonary/chest: Effort normal and breath sounds normal. No wheezing, rales or rhonchi. Abdominal: Soft, nondistended, nontender. Bowel sounds active throughout. There are no masses palpable. No hepatomegaly. Extremities: No edema Neurological: Alert and oriented to person place and time. Skin: Skin is warm and dry. No rashes noted. Psychiatric: Normal mood and affect. Behavior is normal.  Labs 12/2014: Acute hepatitis panel negative. GGT elevated at 204. Alk phos isoenzymes with elevated liver enzymes 70%. AMA negative.   Labs 12/2022: CMP with elevated AST of 38, ALT of 38, alk phos of 128.   RUQ U/S 01/15/15: IMPRESSION: Unremarkable right upper quadrant ultrasound.  MRI/MRCP 02/27/15: IMPRESSION: 13 mm hypervascular lesion inferiorly in the medial segment left hepatic lobe, favored to reflect benign FNH, less likely hepatic adenoma. Additional 6 mm T2 hyperintense lesion in the posterior segment right hepatic lobe, favored to reflect a benign hemangioma, left likely FNH. If definitive characterization is desired, consider follow-up MRI abdomen with/without Eovist contrast in 3 months. No findings to account for the patient's abnormal LFTs. Specifically: No morphologic findings of cirrhosis. No hepatic steatosis. No intrahepatic or extrahepatic ductal dilatation.  ASSESSMENT AND PLAN: Colon cancer screening Elevated LFTs Patient presents to discuss a colonoscopy for colon cancer screening.  I went over the procedure in detail with the patient, and she  is agreeable to proceeding.  Patient was on recent labs noted to have a mildly elevated AST, ALT, and alk phos. she has previously been noted to have a negative AMA.  Not clear at this time what the etiology of her mildly elevated LFTs could be since patient does not have the typical phenotype for fatty liver disease and denies any significant alcohol use.  I did offer the patient the opportunity to perform a  workup for an etiology of her elevated LFTs, but the patient declined at this time and will let me know if she is interested in the future. Patient's FIB-4 is 1.41, suggesting against advanced liver fibrosis. - Patient declined further work up of elevated LFTs for now - Colonoscopy [facility]  Christia Reading, MD  I spent 49 minutes of time, including in depth chart review, independent review of results as outlined above, communicating results with the patient directly, face-to-face time with the patient, coordinating care, ordering studies and medications as appropriate, and documentation.

## 2023-03-09 NOTE — Patient Instructions (Addendum)
You have been scheduled for a colonoscopy. Please follow written instructions given to you at your visit today.  Please pick up your prep supplies at the pharmacy within the next 1-3 days. If you use inhalers (even only as needed), please bring them with you on the day of your procedure.   We have sent the following medications to your pharmacy for you to pick up at your convenience:Suprep  _______________________________________________________  If your blood pressure at your visit was 140/90 or greater, please contact your primary care physician to follow up on this.  _______________________________________________________  If you are age 68 or older, your body mass index should be between 23-30. Your Body mass index is 18.37 kg/m. If this is out of the aforementioned range listed, please consider follow up with your Primary Care Provider.  If you are age 71 or younger, your body mass index should be between 19-25. Your Body mass index is 18.37 kg/m. If this is out of the aformentioned range listed, please consider follow up with your Primary Care Provider.   ________________________________________________________  The [place] [facility] providers would like to encourage you to use [facility] to communicate with providers for non-urgent requests or questions.  Due to long hold times on the telephone, sending your provider a message by [facility] may be a faster and more efficient way to get a response.  Please allow 48 business hours for a response.  Please remember that this is for non-urgent requests.  _______________________________________________________   Due to recent changes in healthcare laws, you may see the results of your imaging and laboratory studies on MyChart before your provider has had a chance to review them.  We understand that in some cases there may be results that are confusing or concerning to you. Not all laboratory results come back in the same time frame and the provider may be  waiting for multiple results in order to interpret others.  Please give [place] 48 hours in order for your provider to thoroughly review all the results before contacting the office for clarification of your results.    Thank you for entrusting me with your care and for choosing [facility], Dr. Christia Reading

## 2023-03-23 ENCOUNTER — Encounter: Payer: Self-pay | Admitting: Internal Medicine

## 2023-04-03 ENCOUNTER — Ambulatory Visit (AMBULATORY_SURGERY_CENTER): Payer: Medicare Other | Admitting: Internal Medicine

## 2023-04-03 ENCOUNTER — Encounter: Payer: Self-pay | Admitting: Internal Medicine

## 2023-04-03 VITALS — BP 97/61 | HR 80 | Temp 98.0°F | Resp 13 | Ht 65.0 in | Wt 110.0 lb

## 2023-04-03 DIAGNOSIS — K635 Polyp of colon: Secondary | ICD-10-CM | POA: Diagnosis not present

## 2023-04-03 DIAGNOSIS — Z1211 Encounter for screening for malignant neoplasm of colon: Secondary | ICD-10-CM

## 2023-04-03 DIAGNOSIS — D122 Benign neoplasm of ascending colon: Secondary | ICD-10-CM

## 2023-04-03 DIAGNOSIS — D120 Benign neoplasm of cecum: Secondary | ICD-10-CM | POA: Diagnosis not present

## 2023-04-03 MED ORDER — SODIUM CHLORIDE 0.9 % IV SOLN: 500.0000 mL | Freq: Once | INTRAVENOUS | Status: DC

## 2023-04-03 NOTE — Progress Notes (Signed)
Uneventful anesthetic. Report to pacu rn. Vss. Care resumed by rn. 

## 2023-04-03 NOTE — Op Note (Signed)
[facility] Patient Name: Renee Watson Procedure Date: 04/03/2023 1:26 PM MRN: [id number] Endoscopist: Madelyn Brunner [place] , , [id number] Age: 67 Referring MD:  Date of Birth: 12/12/1956 Gender: Female Account #: [id number] Procedure:                Colonoscopy Indications:              Screening for colorectal malignant neoplasm Medicines:                Monitored Anesthesia Care Procedure:                Pre-Anesthesia Assessment:                           - Prior to the procedure, a History and Physical                            was performed, and patient medications and                            allergies were reviewed. The patient's tolerance of                            previous anesthesia was also reviewed. The risks                            and benefits of the procedure and the sedation                            options and risks were discussed with the patient.                            All questions were answered, and informed consent                            was obtained. Prior Anticoagulants: The patient has                            taken no anticoagulant or antiplatelet agents. ASA                            Grade Assessment: II - A patient with mild systemic                            disease. After reviewing the risks and benefits,                            the patient was deemed in satisfactory condition to                            undergo the procedure.                           After obtaining informed consent, the colonoscope  was passed under direct vision. Throughout the                            procedure, the patient's blood pressure, pulse, and                            oxygen saturations were monitored continuously. The                            Olympus [id number]-[id number]) Colonoscope was                            introduced through the anus and advanced to the the                            terminal  ileum. The colonoscopy was performed                            without difficulty. The patient tolerated the                            procedure well. The quality of the bowel                            preparation was excellent. The terminal ileum,                            ileocecal valve, appendiceal orifice, and rectum                            were photographed. Scope In: 1:29:23 PM Scope Out: 1:52:29 PM Scope Withdrawal Time: 0 hours 14 minutes 0 seconds  Total Procedure Duration: 0 hours 23 minutes 6 seconds  Findings:                 The terminal ileum appeared normal.                           Five sessile polyps were found in the ascending                            colon and cecum. The polyps were 3 to 4 mm in size.                            These polyps were removed with a cold snare.                            Resection and retrieval were complete.                           Non-bleeding internal hemorrhoids were found during                            retroflexion. Complications:  No immediate complications. Estimated Blood Loss:     Estimated blood loss was minimal. Impression:               - The examined portion of the ileum was normal.                           - Five 3 to 4 mm polyps in the ascending colon and                            in the cecum, removed with a cold snare. Resected                            and retrieved.                           - Non-bleeding internal hemorrhoids. Recommendation:           - Discharge patient to home (with escort).                           - Await pathology results.                           - The findings and recommendations were discussed                            with the patient. Dr Particia Lather "Alan Ripper" Leonides Schanz,  04/03/2023 1:56:03 PM

## 2023-04-03 NOTE — Progress Notes (Signed)
VS by EC  Pt's states no medical or surgical changes since previsit or office visit.  

## 2023-04-03 NOTE — Progress Notes (Signed)
Called to room to assist during endoscopic procedure.  Patient ID and intended procedure confirmed with present staff. Received instructions for my participation in the procedure from the performing physician.  

## 2023-04-03 NOTE — Progress Notes (Signed)
GASTROENTEROLOGY PROCEDURE H&P NOTE   Primary Care Physician: Georgina Quint, MD    Reason for Procedure:   Colon cancer screening  Plan:    Colonoscopy  Patient is appropriate for endoscopic procedure(s) in the ambulatory ([facility]) setting.  The nature of the procedure, as well as the risks, benefits, and alternatives were carefully and thoroughly reviewed with the patient. Ample time for discussion and questions allowed. The patient understood, was satisfied, and agreed to proceed.     HPI: Renee Watson is a 67 y.o. female who presents for colonoscopy for evaluation of colon cancer screening .  Patient was most recently seen in the Gastroenterology Clinic on 03/09/23.  No interval change in medical history since that appointment. Please refer to that note for full details regarding GI history and clinical presentation.   Past Medical History:  Diagnosis Date   Hyperbilirubinemia    Thyroid disease     Past Surgical History:  Procedure Laterality Date   NO PAST SURGERIES      Prior to Admission medications   Medication Sig Start Date End Date Taking? Authorizing Provider  citalopram (CELEXA) 10 MG tablet Take 1 tablet (10 mg total) by mouth daily. 01/25/23  Yes Sagardia, Eilleen Kempf, MD  levothyroxine (SYNTHROID) 25 MCG tablet Take 1 tablet (25 mcg total) by mouth daily. 01/23/23  Yes Sagardia, Eilleen Kempf, MD  XDEMVY 0.25 % SOLN Apply 1 drop to eye 2 (two) times daily. 03/07/23  Yes [provider]    Current Outpatient Medications  Medication Sig Dispense Refill   citalopram (CELEXA) 10 MG tablet Take 1 tablet (10 mg total) by mouth daily. 90 tablet 3   levothyroxine (SYNTHROID) 25 MCG tablet Take 1 tablet (25 mcg total) by mouth daily. 90 tablet 3   XDEMVY 0.25 % SOLN Apply 1 drop to eye 2 (two) times daily.     Current Facility-Administered Medications  Medication Dose Route Frequency Provider Last Rate Last Admin   0.9 %  sodium chloride infusion   500 mL Intravenous Once Imogene Burn, MD        Allergies as of 04/03/2023   (No Known Allergies)    Family History  Problem Relation Age of Onset   Hyperlipidemia Mother    Diabetes Mother    Liver disease Father    Alcoholism Father    Colon cancer Neg Hx    Colon polyps Neg Hx    Esophageal cancer Neg Hx    Pancreatic cancer Neg Hx    Stomach cancer Neg Hx    Rectal cancer Neg Hx     Social History   Socioeconomic History   Marital status: Divorced    Spouse name: Not on file   Number of children: Not on file   Years of education: Not on file   Highest education level: Not on file  Occupational History   Not on file  Tobacco Use   Smoking status: Never   Smokeless tobacco: Never  Vaping Use   Vaping Use: Never used  Substance and Sexual Activity   Alcohol use: Not Currently   Drug use: No   Sexual activity: Not on file  Other Topics Concern   Not on file  Social History Narrative   Not on file   Social Determinants of Health   Financial Resource Strain: Not on file  Food Insecurity: Not on file  Transportation Needs: Not on file  Physical Activity: Not on file  Stress: Not on file  Social Connections: Not on file  Intimate Partner Violence: Not on file    Physical Exam: Vital signs in last 24 hours: BP 101/67   Pulse 87   Temp 98 F (36.7 C)   Ht 5\' 5"  (1.651 m)   Wt 110 lb (49.9 kg)   SpO2 98%   BMI 18.30 kg/m  GEN: NAD EYE: Sclerae anicteric ENT: MMM CV: Non-tachycardic Pulm: No increased WOB GI: Soft NEURO:  Alert & Oriented   Eulah Pont, MD [facility]   04/03/2023 1:17 PM

## 2023-04-03 NOTE — Patient Instructions (Signed)
   Handouts on polyps & hemorrhoids given to you today  Await pathology results on polyps removed    YOU HAD AN ENDOSCOPIC PROCEDURE TODAY AT THE [facility]:   Refer to the procedure report that was given to you for any specific questions about what was found during the examination.  If the procedure report does not answer your questions, please call your gastroenterologist to clarify.  If you requested that your care partner not be given the details of your procedure findings, then the procedure report has been included in a sealed envelope for you to review at your convenience later.  YOU SHOULD EXPECT: Some feelings of bloating in the abdomen. Passage of more gas than usual.  Walking can help get rid of the air that was put into your GI tract during the procedure and reduce the bloating. If you had a lower endoscopy (such as a colonoscopy or flexible sigmoidoscopy) you may notice spotting of blood in your stool or on the toilet paper. If you underwent a bowel prep for your procedure, you may not have a normal bowel movement for a few days.  Please Note:  You might notice some irritation and congestion in your nose or some drainage.  This is from the oxygen used during your procedure.  There is no need for concern and it should clear up in a day or so.  SYMPTOMS TO REPORT IMMEDIATELY:  Following lower endoscopy (colonoscopy or flexible sigmoidoscopy):  Excessive amounts of blood in the stool  Significant tenderness or worsening of abdominal pains  Swelling of the abdomen that is new, acute  Fever of 100F or higher   For urgent or emergent issues, a gastroenterologist can be reached at any hour by calling [phone]. Do not use MyChart messaging for urgent concerns.    DIET:  We do recommend a small meal at first, but then you may proceed to your regular diet.  Drink plenty of fluids but you should avoid alcoholic beverages for 24 hours.  ACTIVITY:  You should plan to  take it easy for the rest of today and you should NOT DRIVE or use heavy machinery until tomorrow (because of the sedation medicines used during the test).    FOLLOW UP: Our staff will call the number listed on your records the next business day following your procedure.  We will call around 7:15- 8:00 am to check on you and address any questions or concerns that you may have regarding the information given to you following your procedure. If we do not reach you, we will leave a message.     If any biopsies were taken you will be contacted by phone or by letter within the next 1-3 weeks.  Please call us at [phone] if you have not heard about the biopsies in 3 weeks.    SIGNATURES/CONFIDENTIALITY: You and/or your care partner have signed paperwork which will be entered into your electronic medical record.  These signatures attest to the fact that that the information above on your After Visit Summary has been reviewed and is understood.  Full responsibility of the confidentiality of this discharge information lies with you and/or your care-partner. 

## 2023-04-04 ENCOUNTER — Telehealth: Payer: Self-pay | Admitting: *Deleted

## 2023-04-04 NOTE — Telephone Encounter (Signed)
  Follow up Call-     04/03/2023   12:48 PM  Call back number  Post procedure Call Back phone  # [phone]  Permission to leave phone message Yes     Patient questions:  Do you have a fever, pain , or abdominal swelling? No. Pain Score  0 *  Have you tolerated food without any problems? Yes.    Have you been able to return to your normal activities? Yes.    Do you have any questions about your discharge instructions: Diet   No. Medications  No. Follow up visit  No.  Do you have questions or concerns about your Care? No.  Actions: * If pain score is 4 or above: No action needed, pain <4.

## 2023-04-10 ENCOUNTER — Encounter: Payer: Self-pay | Admitting: Internal Medicine

## 2023-04-17 DIAGNOSIS — B880 Other acariasis: Secondary | ICD-10-CM | POA: Diagnosis not present

## 2023-04-17 DIAGNOSIS — H00015 Hordeolum externum left lower eyelid: Secondary | ICD-10-CM | POA: Diagnosis not present

## 2023-11-15 DIAGNOSIS — H2513 Age-related nuclear cataract, bilateral: Secondary | ICD-10-CM | POA: Diagnosis not present

## 2023-11-15 DIAGNOSIS — H35041 Retinal micro-aneurysms, unspecified, right eye: Secondary | ICD-10-CM | POA: Diagnosis not present

## 2023-11-15 DIAGNOSIS — H52223 Regular astigmatism, bilateral: Secondary | ICD-10-CM | POA: Diagnosis not present

## 2023-11-15 DIAGNOSIS — H43811 Vitreous degeneration, right eye: Secondary | ICD-10-CM | POA: Diagnosis not present

## 2023-11-15 DIAGNOSIS — H5213 Myopia, bilateral: Secondary | ICD-10-CM | POA: Diagnosis not present

## 2023-11-15 DIAGNOSIS — H524 Presbyopia: Secondary | ICD-10-CM | POA: Diagnosis not present

## 2023-11-27 ENCOUNTER — Ambulatory Visit
Admission: EM | Admit: 2023-11-27 | Discharge: 2023-11-27 | Disposition: A | Discharge status: Home / Self Care | Payer: Medicare Other | Attending: Physician Assistant | Admitting: Physician Assistant

## 2023-11-27 DIAGNOSIS — J069 Acute upper respiratory infection, unspecified: Secondary | ICD-10-CM | POA: Diagnosis not present

## 2023-11-27 LAB — POCT INFLUENZA A/B
Influenza A, POC: NEGATIVE
Influenza B, POC: NEGATIVE

## 2023-11-27 MED ORDER — ACETAMINOPHEN 325 MG PO TABS
650.0000 mg | ORAL_TABLET | Freq: Once | ORAL | Status: AC
  Administered 2023-11-27: 650 mg via ORAL

## 2023-11-27 NOTE — ED Provider Notes (Signed)
[facility]    CSN: [id number] Arrival date & time: 11/27/23  1734      History   Chief Complaint Chief Complaint  Patient presents with   Headache   Fatigue   Fever    HPI Renee Watson is a 67 y.o. female.   Here today for evaluation of fever and chills that started 4 days ago.  She reports she has had some cough as well and congestion.  She denies any shortness of breath or wheezing.  She has not had sore throat.  She has been vaccinated for flu and COVID.  The history is provided by the patient.  Headache Associated symptoms: congestion, cough and fever   Associated symptoms: no abdominal pain, no diarrhea, no ear pain, no nausea, no sore throat and no vomiting   Fever Associated symptoms: chills, congestion, cough and headaches   Associated symptoms: no diarrhea, no ear pain, no nausea, no sore throat and no vomiting     Past Medical History:  Diagnosis Date   Hyperbilirubinemia    Thyroid disease     Patient Active Problem List   Diagnosis Date Noted   Adjustment disorder with anxious mood 01/18/2019   Elevated alkaline phosphatase level 03/03/2015   Underweight 01/10/2015   Other specified hypothyroidism 01/02/2014    Past Surgical History:  Procedure Laterality Date   NO PAST SURGERIES      OB History   No obstetric history on file.      Home Medications    Prior to Admission medications   Medication Sig Start Date End Date Taking? Authorizing Provider  levothyroxine (SYNTHROID) 25 MCG tablet Take 1 tablet (25 mcg total) by mouth daily. 01/23/23  Yes Sagardia, Eilleen Kempf, MD  citalopram (CELEXA) 10 MG tablet Take 1 tablet (10 mg total) by mouth daily. 01/25/23   Georgina Quint, MD  XDEMVY 0.25 % SOLN Apply 1 drop to eye 2 (two) times daily. 03/07/23   [provider]    Family History Family History  Problem Relation Age of Onset   Hyperlipidemia Mother    Diabetes Mother    Liver disease Father     Alcoholism Father    Colon cancer Neg Hx    Colon polyps Neg Hx    Esophageal cancer Neg Hx    Pancreatic cancer Neg Hx    Stomach cancer Neg Hx    Rectal cancer Neg Hx     Social History Social History   Tobacco Use   Smoking status: Never   Smokeless tobacco: Never  Vaping Use   Vaping status: Never Used  Substance Use Topics   Alcohol use: Not Currently   Drug use: No     Allergies   Patient has no known allergies.   Review of Systems Review of Systems  Constitutional:  Positive for chills and fever.  HENT:  Positive for congestion. Negative for ear pain and sore throat.   Eyes:  Negative for discharge and redness.  Respiratory:  Positive for cough. Negative for shortness of breath and wheezing.   Gastrointestinal:  Negative for abdominal pain, diarrhea, nausea and vomiting.  Neurological:  Positive for headaches.     Physical Exam Triage Vital Signs ED Triage Vitals  Encounter Vitals Group     BP      Systolic BP Percentile      Diastolic BP Percentile      Pulse      Resp      Temp  Temp src      SpO2      Weight      Height      Head Circumference      Peak Flow      Pain Score      Pain Loc      Pain Education      Exclude from Growth Chart    No data found.  Updated Vital Signs BP 120/65 (BP Location: Right Arm)   Pulse 100   Temp (!) 100.9 F (38.3 C) (Oral)   Resp 18   Ht 5\' 5"  (1.651 m)   Wt 110 lb 0.2 oz (49.9 kg)   SpO2 96%   BMI 18.31 kg/m      Physical Exam Vitals and nursing note reviewed.  Constitutional:      General: She is not in acute distress.    Appearance: Normal appearance. She is not ill-appearing.  HENT:     Head: Normocephalic and atraumatic.     Nose: Congestion present.     Mouth/Throat:     Mouth: Mucous membranes are moist.     Pharynx: No oropharyngeal exudate or posterior oropharyngeal erythema.  Eyes:     Conjunctiva/sclera: Conjunctivae normal.  Cardiovascular:     Rate and Rhythm: Normal  rate and regular rhythm.     Heart sounds: Normal heart sounds. No murmur heard. Pulmonary:     Effort: Pulmonary effort is normal. No respiratory distress.     Breath sounds: Normal breath sounds. No wheezing, rhonchi or rales.  Skin:    General: Skin is warm and dry.  Neurological:     Mental Status: She is alert.  Psychiatric:        Mood and Affect: Mood normal.        Thought Content: Thought content normal.      UC Treatments / Results  Labs (all labs ordered are listed, but only abnormal results are displayed) Labs Reviewed  POCT INFLUENZA A/B - Normal  SARS CORONAVIRUS 2 (TAT 6-24 HRS)    EKG   Radiology No results found.  Procedures Procedures (including critical care time)  Medications Ordered in UC Medications  acetaminophen (TYLENOL) tablet 650 mg (650 mg Oral Given 11/27/23 1807)    Initial Impression / Assessment and Plan / UC Course  I have reviewed the triage vital signs and the nursing notes.  Pertinent labs & imaging results that were available during my care of the patient were reviewed by me and considered in my medical decision making (see chart for details).    Point-of-care flu testing negative.  Will order COVID screening.  Advised follow-up in the emergency department with any worsening symptoms otherwise we will await results further recommendation.  Encouraged symptomatic treatment in the meantime with increase fluids and rest.  Final Clinical Impressions(s) / UC Diagnoses   Final diagnoses:  Viral upper respiratory tract infection   Discharge Instructions   None    ED Prescriptions   None    PDMP not reviewed this encounter.   Tomi Bamberger, PA-C 11/27/23 1901

## 2023-11-27 NOTE — ED Triage Notes (Signed)
"  This started Thursday night with Fever and chills". Starting to cough the next day with chills, fatigue, Fever of unknown degree unknown". "I have eaten very little, I have no energy, Coughed real bad last night". No known SOB. No wheezing. "I can't seem to get warm". "I have had the Flu and COVID19 vaccine but haven't had the RSV vaccine".

## 2023-11-28 LAB — SARS CORONAVIRUS 2 (TAT 6-24 HRS): SARS Coronavirus 2: NEGATIVE

## 2023-11-29 ENCOUNTER — Encounter: Payer: Self-pay | Admitting: Emergency Medicine

## 2023-11-29 ENCOUNTER — Ambulatory Visit (INDEPENDENT_AMBULATORY_CARE_PROVIDER_SITE_OTHER): Payer: Medicare Other | Admitting: Emergency Medicine

## 2023-11-29 VITALS — BP 118/70 | HR 101 | Temp 98.0°F | Ht 65.0 in | Wt 107.2 lb

## 2023-11-29 DIAGNOSIS — J22 Unspecified acute lower respiratory infection: Secondary | ICD-10-CM | POA: Insufficient documentation

## 2023-11-29 DIAGNOSIS — R051 Acute cough: Secondary | ICD-10-CM | POA: Insufficient documentation

## 2023-11-29 MED ORDER — AZITHROMYCIN 250 MG PO TABS: ORAL_TABLET | ORAL | 0 refills | Status: DC

## 2023-11-29 MED ORDER — DM-GUAIFENESIN ER 30-600 MG PO TB12: 1.0000 | ORAL_TABLET | Freq: Two times a day (BID) | ORAL | 1 refills | Status: DC

## 2023-11-29 MED ORDER — HYDROCODONE BIT-HOMATROP MBR 5-1.5 MG/5ML PO SOLN: 5.0000 mL | Freq: Every evening | ORAL | 0 refills | Status: DC | PRN

## 2023-11-29 MED ORDER — BENZONATATE 200 MG PO CAPS: 200.0000 mg | ORAL_CAPSULE | Freq: Two times a day (BID) | ORAL | 0 refills | Status: DC | PRN

## 2023-11-29 NOTE — Patient Instructions (Signed)

## 2023-11-29 NOTE — Assessment & Plan Note (Signed)
Clinically stable.  No signs of pneumonia. Viral respiratory infection now with secondary bacterial infection.  Recommend daily azithromycin for 5 days Symptom management discussed Advised to rest and stay well-hydrated ED precautions given Advised to contact the office if no better or worse during the next several days

## 2023-11-29 NOTE — Assessment & Plan Note (Signed)
Cough management discussed. Advised to start over-the-counter Mucinex DM and cough drops Recommend to start Tessalon 200 mg 3 times a day and Hycodan syrup at bedtime. Advised to rest and stay well-hydrated

## 2023-11-29 NOTE — Progress Notes (Signed)
Renee Watson 67 y.o.   Chief Complaint  Patient presents with   Cough    Respiratory infection since the night of 11/23/23. Some nasal congestion, coughing, chills, fever, fatigue, very low appetite. [facility] last night tested for FLU & COVID,both negative. Fever comes and goes. Highest 100.9? I alternate with tylenol, advil during the night to bring down fever. Urgent Care PA said "Lungs are clear". This morning I did have a productive cough with green phelgm. Some coughing green phlem Just want to feel better.    HISTORY OF PRESENT ILLNESS: This is a 67 y.o. female complaining of flulike symptoms that started about 1 week ago progressively getting worse Mostly complaining of productive cough along with chills, fever, low appetite, fatigue and nasal congestion Went to urgent care center yesterday.  Tested negative for both COVID and flu. No other associated symptoms No other complaints or medical concerns today.  Cough Associated symptoms include a fever. Pertinent negatives include no chest pain, headaches or shortness of breath.     Prior to Admission medications   Medication Sig Start Date End Date Taking? Authorizing Provider  citalopram (CELEXA) 10 MG tablet Take 1 tablet (10 mg total) by mouth daily. 01/25/23   Georgina Quint, MD  levothyroxine (SYNTHROID) 25 MCG tablet Take 1 tablet (25 mcg total) by mouth daily. 01/23/23   Georgina Quint, MD  XDEMVY 0.25 % SOLN Apply 1 drop to eye 2 (two) times daily. 03/07/23   [provider]    No Known Allergies  Patient Active Problem List   Diagnosis Date Noted   Adjustment disorder with anxious mood 01/18/2019   Elevated alkaline phosphatase level 03/03/2015   Underweight 01/10/2015   Other specified hypothyroidism 01/02/2014    Past Medical History:  Diagnosis Date   Hyperbilirubinemia    Thyroid disease     Past Surgical History:  Procedure Laterality Date   NO PAST SURGERIES       Social History   Socioeconomic History   Marital status: Divorced    Spouse name: Not on file   Number of children: Not on file   Years of education: Not on file   Highest education level: Not on file  Occupational History   Not on file  Tobacco Use   Smoking status: Never   Smokeless tobacco: Never  Vaping Use   Vaping status: Never Used  Substance and Sexual Activity   Alcohol use: Not Currently   Drug use: No   Sexual activity: Not Currently  Other Topics Concern   Not on file  Social History Narrative   Not on file   Social Determinants of Health   Financial Resource Strain: Not on file  Food Insecurity: Not on file  Transportation Needs: Not on file  Physical Activity: Not on file  Stress: Not on file  Social Connections: Not on file  Intimate Partner Violence: Not on file    Family History  Problem Relation Age of Onset   Hyperlipidemia Mother    Diabetes Mother    Liver disease Father    Alcoholism Father    Colon cancer Neg Hx    Colon polyps Neg Hx    Esophageal cancer Neg Hx    Pancreatic cancer Neg Hx    Stomach cancer Neg Hx    Rectal cancer Neg Hx      Review of Systems  Constitutional:  Positive for fever.  HENT:  Positive for congestion.   Respiratory:  Positive  for cough and sputum production. Negative for shortness of breath.   Cardiovascular: Negative.  Negative for chest pain and palpitations.  Gastrointestinal:  Negative for abdominal pain, diarrhea, nausea and vomiting.  Genitourinary: Negative.  Negative for dysuria and hematuria.  Neurological: Negative.  Negative for dizziness and headaches.  All other systems reviewed and are negative.   Vitals:   11/29/23 1040  BP: 118/70  Pulse: (!) 101  Temp: 98 F (36.7 C)  SpO2: 94%    Physical Exam Vitals reviewed.  Constitutional:      Appearance: Normal appearance.  HENT:     Head: Normocephalic.     Right Ear: Tympanic membrane, ear canal and external ear normal.      Left Ear: Tympanic membrane, ear canal and external ear normal.     Mouth/Throat:     Mouth: Mucous membranes are moist.     Pharynx: Oropharynx is clear.  Eyes:     Extraocular Movements: Extraocular movements intact.     Conjunctiva/sclera: Conjunctivae normal.     Pupils: Pupils are equal, round, and reactive to light.  Cardiovascular:     Rate and Rhythm: Normal rate and regular rhythm.     Pulses: Normal pulses.     Heart sounds: Normal heart sounds.  Pulmonary:     Effort: Pulmonary effort is normal.     Breath sounds: Normal breath sounds.  Musculoskeletal:     Cervical back: No tenderness.  Lymphadenopathy:     Cervical: No cervical adenopathy.  Skin:    General: Skin is warm and dry.     Capillary Refill: Capillary refill takes less than 2 seconds.  Neurological:     General: No focal deficit present.     Mental Status: She is alert and oriented to person, place, and time.  Psychiatric:        Mood and Affect: Mood normal.        Behavior: Behavior normal.      ASSESSMENT & PLAN: A total of 34 minutes was spent with the patient and counseling/coordination of care regarding preparing for this visit, review of most recent office visit notes, review of multiple chronic medical conditions and their management, review of all medications, diagnosis of lower respiratory infection and need to start antibiotics, cough management, review of most recent bloodwork results, review of health maintenance items, education on nutrition, prognosis, documentation, and need for follow up.   Problem List Items Addressed This Visit       Respiratory   Lower respiratory infection - Primary    Clinically stable.  No signs of pneumonia. Viral respiratory infection now with secondary bacterial infection.  Recommend daily azithromycin for 5 days Symptom management discussed Advised to rest and stay well-hydrated ED precautions given Advised to contact the office if no better or worse  during the next several days      Relevant Medications   azithromycin (ZITHROMAX) 250 MG tablet     Other   Acute cough    Cough management discussed. Advised to start over-the-counter Mucinex DM and cough drops Recommend to start Tessalon 200 mg 3 times a day and Hycodan syrup at bedtime. Advised to rest and stay well-hydrated      Relevant Medications   benzonatate (TESSALON) 200 MG capsule   HYDROcodone bit-homatropine (HYCODAN) 5-1.5 MG/5ML syrup   dextromethorphan-guaiFENesin (MUCINEX DM) 30-600 MG 12hr tablet   Patient Instructions  Cough, Adult A cough helps to clear your throat and lungs. It may be a sign of  an illness or another condition. A short-term (acute) cough may last 2-3 weeks. A long-term (chronic) cough may last 8 or more weeks. Many things can cause a cough. They include: Illnesses such as: An infection in your throat or lungs. Asthma or other heart or lung problems. Gastroesophageal reflux. This is when acid comes back up from your stomach. Breathing in things that bother (irritate) your lungs. Allergies. Postnasal drip. This is when mucus runs down the back of your throat. Smoking. Some medicines. Follow these instructions at home: Medicines Take over-the-counter and prescription medicines only as told by your doctor. Talk with your doctor before you take cough medicine (cough suppressants). Eating and drinking Do not drink alcohol. Do not drink caffeine. Drink enough fluid to keep your pee (urine) pale yellow. Lifestyle Stay away from cigarette smoke. Do not smoke or use any products that contain nicotine or tobacco. If you need help quitting, ask your doctor. Stay away from things that make you cough. These may include perfume, candles, cleaning products, or campfire smoke. General instructions  Watch for any changes to your cough. Tell your doctor about them. Always cover your mouth when you cough. If the air is dry in your home, use a cool  mist vaporizer or humidifier. If your cough is worse at night, try using extra pillows to raise your head up higher while you sleep. Rest as needed. Contact a doctor if: You have new symptoms. Your symptoms get worse. You cough up pus. You have a fever that does not go away. Your cough does not get better after 2-3 weeks. Cough medicine does not help, and you are not sleeping well. You have pain that gets worse or is not helped with medicine. You are losing weight and do not know why. You have night sweats. Get help right away if: You cough up blood. You have trouble breathing. Your heart is beating very fast. These symptoms may be an emergency. Get help right away. Call 911. Do not wait to see if the symptoms will go away. Do not drive yourself to the hospital. This information is not intended to replace advice given to you by your health care provider. Make sure you discuss any questions you have with your health care provider. Document Revised: 08/12/2022 Document Reviewed: 08/12/2022 Elsevier Patient Education  2024 [employer]     Edwina Barth, MD [facility]

## 2024-01-09 ENCOUNTER — Encounter: Payer: Self-pay | Admitting: Emergency Medicine

## 2024-01-09 ENCOUNTER — Ambulatory Visit (INDEPENDENT_AMBULATORY_CARE_PROVIDER_SITE_OTHER): Payer: Medicare Other | Admitting: Emergency Medicine

## 2024-01-09 ENCOUNTER — Ambulatory Visit (INDEPENDENT_AMBULATORY_CARE_PROVIDER_SITE_OTHER): Payer: Medicare Other

## 2024-01-09 VITALS — BP 128/88 | HR 106 | Temp 98.4°F | Ht 65.0 in | Wt 99.0 lb

## 2024-01-09 DIAGNOSIS — R0989 Other specified symptoms and signs involving the circulatory and respiratory systems: Secondary | ICD-10-CM | POA: Diagnosis not present

## 2024-01-09 DIAGNOSIS — J22 Unspecified acute lower respiratory infection: Secondary | ICD-10-CM | POA: Diagnosis not present

## 2024-01-09 DIAGNOSIS — Z130 Encounter for screening for diseases of the blood and blood-forming organs and certain disorders involving the immune mechanism: Secondary | ICD-10-CM

## 2024-01-09 DIAGNOSIS — R053 Chronic cough: Secondary | ICD-10-CM | POA: Diagnosis not present

## 2024-01-09 DIAGNOSIS — Z1321 Encounter for screening for nutritional disorder: Secondary | ICD-10-CM | POA: Diagnosis not present

## 2024-01-09 DIAGNOSIS — Z13228 Encounter for screening for other metabolic disorders: Secondary | ICD-10-CM

## 2024-01-09 DIAGNOSIS — F4322 Adjustment disorder with anxiety: Secondary | ICD-10-CM

## 2024-01-09 DIAGNOSIS — Z0000 Encounter for general adult medical examination without abnormal findings: Secondary | ICD-10-CM | POA: Diagnosis not present

## 2024-01-09 DIAGNOSIS — J069 Acute upper respiratory infection, unspecified: Secondary | ICD-10-CM

## 2024-01-09 DIAGNOSIS — Z13220 Encounter for screening for lipoid disorders: Secondary | ICD-10-CM

## 2024-01-09 DIAGNOSIS — E038 Other specified hypothyroidism: Secondary | ICD-10-CM

## 2024-01-09 DIAGNOSIS — Z0001 Encounter for general adult medical examination with abnormal findings: Secondary | ICD-10-CM

## 2024-01-09 DIAGNOSIS — Z1231 Encounter for screening mammogram for malignant neoplasm of breast: Secondary | ICD-10-CM

## 2024-01-09 DIAGNOSIS — Z1329 Encounter for screening for other suspected endocrine disorder: Secondary | ICD-10-CM

## 2024-01-09 DIAGNOSIS — R918 Other nonspecific abnormal finding of lung field: Secondary | ICD-10-CM | POA: Diagnosis not present

## 2024-01-09 DIAGNOSIS — J479 Bronchiectasis, uncomplicated: Secondary | ICD-10-CM | POA: Diagnosis not present

## 2024-01-09 LAB — CBC WITH DIFFERENTIAL/PLATELET
Basophils Absolute: 0.1 10*3/uL (ref 0.0–0.1)
Basophils Relative: 1.1 % (ref 0.0–3.0)
Eosinophils Absolute: 0.1 10*3/uL (ref 0.0–0.7)
Eosinophils Relative: 1 % (ref 0.0–5.0)
HCT: 40.6 % (ref 36.0–46.0)
Hemoglobin: 13.7 g/dL (ref 12.0–15.0)
Lymphocytes Relative: 21 % (ref 12.0–46.0)
Lymphs Abs: 1.4 10*3/uL (ref 0.7–4.0)
MCHC: 33.8 g/dL (ref 30.0–36.0)
MCV: 88.1 fL (ref 78.0–100.0)
Monocytes Absolute: 0.6 10*3/uL (ref 0.1–1.0)
Monocytes Relative: 9.4 % (ref 3.0–12.0)
Neutro Abs: 4.5 10*3/uL (ref 1.4–7.7)
Neutrophils Relative %: 67.5 % (ref 43.0–77.0)
Platelets: 419 10*3/uL — ABNORMAL HIGH (ref 150.0–400.0)
RBC: 4.61 Mil/uL (ref 3.87–5.11)
RDW: 13.2 % (ref 11.5–15.5)
WBC: 6.6 10*3/uL (ref 4.0–10.5)

## 2024-01-09 LAB — LIPID PANEL
Cholesterol: 162 mg/dL (ref 0–200)
HDL: 55.5 mg/dL (ref >39.00)
LDL Cholesterol: 89 mg/dL (ref 0–99)
NonHDL: 106.54
Total CHOL/HDL Ratio: 3
Triglycerides: 86 mg/dL (ref 0.0–149.0)
VLDL: 17.2 mg/dL (ref 0.0–40.0)

## 2024-01-09 LAB — COMPREHENSIVE METABOLIC PANEL
ALT: 29 U/L (ref 0–35)
AST: 32 U/L (ref 0–37)
Albumin: 4.4 g/dL (ref 3.5–5.2)
Alkaline Phosphatase: 190 U/L — ABNORMAL HIGH (ref 39–117)
BUN: 10 mg/dL (ref 6–23)
CO2: 28 meq/L (ref 19–32)
Calcium: 9.9 mg/dL (ref 8.4–10.5)
Chloride: 100 meq/L (ref 96–112)
Creatinine, Ser: 0.48 mg/dL (ref 0.40–1.20)
GFR: 97.43 mL/min (ref >60.00)
Glucose, Bld: 91 mg/dL (ref 70–99)
Potassium: 4.5 meq/L (ref 3.5–5.1)
Sodium: 138 meq/L (ref 135–145)
Total Bilirubin: 1.2 mg/dL (ref 0.2–1.2)
Total Protein: 7.9 g/dL (ref 6.0–8.3)

## 2024-01-09 LAB — HEMOGLOBIN A1C: Hgb A1c MFr Bld: 5.5 % (ref 4.6–6.5)

## 2024-01-09 LAB — TSH: TSH: 2.88 u[IU]/mL (ref 0.35–5.50)

## 2024-01-09 MED ORDER — AMOXICILLIN-POT CLAVULANATE 875-125 MG PO TABS: 1.0000 | ORAL_TABLET | Freq: Two times a day (BID) | ORAL | 0 refills | Status: AC

## 2024-01-09 NOTE — Assessment & Plan Note (Addendum)
 Clinically stable.  No complications.  No signs of secondary bacterial infection.  Symptom management discussed. Continue over-the-counter Mucinex  DM and cough drops Advised to rest and stay well-hydrated Chest x-ray done today.  Report reviewed. Background of emphysema with possible infection Recommend to start Augmentin  875 mg twice a day for 7 days

## 2024-01-09 NOTE — Assessment & Plan Note (Signed)
 Clinically stable. Continues Celexa 10 mg daily

## 2024-01-09 NOTE — Patient Instructions (Signed)

## 2024-01-09 NOTE — Assessment & Plan Note (Signed)
Clinically euthyroid.  TSH done today.  Continue Synthroid 25 mcg daily. ?

## 2024-01-09 NOTE — Progress Notes (Signed)
 Renee Watson 68 y.o.   Chief Complaint  Patient presents with   Annual Exam    Patient c/o a respiratory infection states she's been on off with this. Low grade fever, no sore throat, sinus and chest congestion.     HISTORY OF PRESENT ILLNESS: This is a 68 y.o. female here for annual exam. Also still complaining of flulike symptoms.  Last office visit on 11/29/2023 for lower respiratory infection.  Took azithromycin  for 5 days. Felt better until Christmas Eve.  Got sick again initially with stomach bug followed by relapse of respiratory symptoms.  Still having lingering cough and congestion No other associated symptoms History of hypothyroidism and anxiety disorder No other complaints or medical concerns today.  HPI   Prior to Admission medications   Medication Sig Start Date End Date Taking? Authorizing Provider  citalopram  (CELEXA ) 10 MG tablet Take 1 tablet (10 mg total) by mouth daily. 01/25/23  Yes Traeson Dusza, Emil Schanz, MD  HYDROcodone  bit-homatropine [facility]) 5-1.5 MG/5ML syrup Take 5 mLs by mouth at bedtime as needed for cough. 11/29/23  Yes Nychelle Cassata, Emil Schanz, MD  levothyroxine  (SYNTHROID ) 25 MCG tablet Take 1 tablet (25 mcg total) by mouth daily. 01/23/23  Yes Starasia Sinko Jose, MD  XDEMVY 0.25 % SOLN Apply 1 drop to eye 2 (two) times daily. 03/07/23  Yes [provider]  azithromycin  (ZITHROMAX ) 250 MG tablet Sig as indicated Patient not taking: Reported on 01/09/2024 11/29/23   Jlyn Cerros Jose, MD  benzonatate  (TESSALON ) 200 MG capsule Take 1 capsule (200 mg total) by mouth 2 (two) times daily as needed for cough. Patient not taking: Reported on 01/09/2024 11/29/23   Purcell Emil Schanz, MD  dextromethorphan-guaiFENesin  (MUCINEX  DM) 30-600 MG 12hr tablet Take 1 tablet by mouth 2 (two) times daily. Patient not taking: Reported on 01/09/2024 11/29/23   Purcell Emil Schanz, MD    No Known Allergies  Patient Active Problem List   Diagnosis Date  Noted   Adjustment disorder with anxious mood 01/18/2019   Elevated alkaline phosphatase level 03/03/2015   Underweight 01/10/2015   Other specified hypothyroidism 01/02/2014    Past Medical History:  Diagnosis Date   Hyperbilirubinemia    Thyroid  disease     Past Surgical History:  Procedure Laterality Date   NO PAST SURGERIES      Social History   Socioeconomic History   Marital status: Divorced    Spouse name: Not on file   Number of children: Not on file   Years of education: Not on file   Highest education level: Bachelor's degree (e.g., BA, AB, BS)  Occupational History   Not on file  Tobacco Use   Smoking status: Never   Smokeless tobacco: Never  Vaping Use   Vaping status: Never Used  Substance and Sexual Activity   Alcohol use: Not Currently   Drug use: No   Sexual activity: Not Currently  Other Topics Concern   Not on file  Social History Narrative   Not on file   Social Drivers of Health   Financial Resource Strain: Medium Risk (01/08/2024)   Overall Financial Resource Strain (CARDIA)    Difficulty of Paying Living Expenses: Somewhat hard  Food Insecurity: No Food Insecurity (01/08/2024)   Hunger Vital Sign    Worried About Running Out of Food in the Last Year: Never true    Ran Out of Food in the Last Year: Never true  Transportation Needs: No Transportation Needs (01/08/2024)   PRAPARE - Transportation  Lack of Transportation (Medical): No    Lack of Transportation (Non-Medical): No  Physical Activity: Insufficiently Active (01/08/2024)   Exercise Vital Sign    Days of Exercise per Week: 4 days    Minutes of Exercise per Session: 20 min  Stress: Stress Concern Present (01/08/2024)   [employer] of Occupational Health - Occupational Stress Questionnaire    Feeling of Stress : To some extent  Social Connections: Moderately Isolated (01/08/2024)   Social Connection and Isolation Panel [NHANES]    Frequency of Communication with Friends and  Family: Three times a week    Frequency of Social Gatherings with Friends and Family: Three times a week    Attends Religious Services: 1 to 4 times per year    Active Member of Clubs or Organizations: No    Attends Engineer, Structural: Not on file    Marital Status: Divorced  Catering Manager Violence: Not on file    Family History  Problem Relation Age of Onset   Hyperlipidemia Mother    Diabetes Mother    Liver disease Father    Alcoholism Father    Colon cancer Neg Hx    Colon polyps Neg Hx    Esophageal cancer Neg Hx    Pancreatic cancer Neg Hx    Stomach cancer Neg Hx    Rectal cancer Neg Hx      Review of Systems  Constitutional: Negative.  Negative for chills and fever.  HENT:  Positive for congestion.   Eyes: Negative.   Respiratory:  Positive for cough. Negative for hemoptysis and shortness of breath.   Cardiovascular:  Negative for chest pain and palpitations.  Gastrointestinal:  Negative for abdominal pain, diarrhea, nausea and vomiting.  Genitourinary: Negative.  Negative for dysuria and hematuria.  Skin: Negative.  Negative for rash.  Neurological: Negative.  Negative for dizziness and headaches.  All other systems reviewed and are negative.   Today's Vitals   01/09/24 0818  BP: 128/88  Pulse: (!) 106  Temp: 98.4 F (36.9 C)  TempSrc: Oral  SpO2: 97%  Weight: 99 lb (44.9 kg)  Height: 5' 5 (1.651 m)   Body mass index is 16.47 kg/m.   Physical Exam Vitals reviewed.  Constitutional:      Appearance: Normal appearance.  HENT:     Head: Normocephalic.     Right Ear: Tympanic membrane, ear canal and external ear normal.     Left Ear: Tympanic membrane, ear canal and external ear normal.     Mouth/Throat:     Mouth: Mucous membranes are moist.     Pharynx: Oropharynx is clear.  Eyes:     Extraocular Movements: Extraocular movements intact.     Conjunctiva/sclera: Conjunctivae normal.     Pupils: Pupils are equal, round, and reactive  to light.  Cardiovascular:     Rate and Rhythm: Normal rate and regular rhythm.     Pulses: Normal pulses.     Heart sounds: Normal heart sounds.  Pulmonary:     Effort: Pulmonary effort is normal.     Breath sounds: Normal breath sounds.  Abdominal:     Palpations: Abdomen is soft.     Tenderness: There is no abdominal tenderness.  Musculoskeletal:        General: Normal range of motion.     Cervical back: No tenderness.     Right lower leg: No edema.     Left lower leg: No edema.  Lymphadenopathy:     Cervical: No  cervical adenopathy.  Skin:    General: Skin is warm and dry.  Neurological:     Mental Status: She is alert and oriented to person, place, and time.  Psychiatric:        Mood and Affect: Mood normal.        Behavior: Behavior normal.      ASSESSMENT & PLAN: Problem List Items Addressed This Visit       Respiratory   Lower respiratory infection   Clinically stable.  No complications.  No signs of secondary bacterial infection.  Symptom management discussed. Continue over-the-counter Mucinex  DM and cough drops Advised to rest and stay well-hydrated Chest x-ray done today.  Report reviewed. Background of emphysema with possible infection Recommend to start Augmentin  875 mg twice a day for 7 days      Relevant Medications   amoxicillin -clavulanate (AUGMENTIN ) 875-125 MG tablet     Endocrine   Other specified hypothyroidism   Clinically euthyroid.  TSH done today. Continue Synthroid  25 mcg daily      Relevant Orders   TSH     Other   Adjustment disorder with anxious mood   Clinically stable. Continues Celexa  10 mg daily      Persistent cough   Cough management discussed Recommend to continue over-the-counter Mucinex  DM and cough drops Advised to rest and stay well-hydrated Viral infection running its course      Relevant Orders   DG Chest 2 View (Completed)   Other Visit Diagnoses       Encounter for general adult medical examination  with abnormal findings    -  Primary   Relevant Orders   TSH   CBC with Differential/Platelet   Comprehensive metabolic panel   Lipid panel   Hemoglobin A1c     Visit for screening mammogram       Relevant Orders   Mammogram Digital Screening     Screening for deficiency anemia       Relevant Orders   CBC with Differential/Platelet     Screening for lipoid disorders       Relevant Orders   Lipid panel     Screening for endocrine, nutritional, metabolic and immunity disorder       Relevant Orders   Comprehensive metabolic panel   Hemoglobin A1c        Modifiable risk factors discussed with patient. Anticipatory guidance according to age provided. The following topics were also discussed: Social Determinants of Health Smoking Diet and nutrition Benefits of exercise Cancer screening and need for breast cancer screening with mammogram Vaccinations reviewed and recommendations Cardiovascular risk assessment and need for blood work The 10-year ASCVD risk score (Arnett DK, et al., 2019) is: 7.2%   Values used to calculate the score:     Age: 68 years     Sex: Female     Is Non-Hispanic African American: No     Diabetic: No     Tobacco smoker: No     Systolic Blood Pressure: 128 mmHg     Is BP treated: No     HDL Cholesterol: 81.5 mg/dL     Total Cholesterol: 230 mg/dL Review of chronic medical conditions under management Review of all medications Mental health including depression and anxiety Fall and accident prevention  Patient Instructions  Health Maintenance, Female Adopting a healthy lifestyle and getting preventive care are important in promoting health and wellness. Ask your health care provider about: The right schedule for you to have regular tests and exams.  Things you can do on your own to prevent diseases and keep yourself healthy. What should I know about diet, weight, and exercise? Eat a healthy diet  Eat a diet that includes plenty of vegetables,  fruits, low-fat dairy products, and lean protein. Do not eat a lot of foods that are high in solid fats, added sugars, or sodium. Maintain a healthy weight Body mass index (BMI) is used to identify weight problems. It estimates body fat based on height and weight. Your health care provider can help determine your BMI and help you achieve or maintain a healthy weight. Get regular exercise Get regular exercise. This is one of the most important things you can do for your health. Most adults should: Exercise for at least 150 minutes each week. The exercise should increase your heart rate and make you sweat (moderate-intensity exercise). Do strengthening exercises at least twice a week. This is in addition to the moderate-intensity exercise. Spend less time sitting. Even light physical activity can be beneficial. Watch cholesterol and blood lipids Have your blood tested for lipids and cholesterol at 69 years of age, then have this test every 5 years. Have your cholesterol levels checked more often if: Your lipid or cholesterol levels are high. You are older than 69 years of age. You are at high risk for heart disease. What should I know about cancer screening? Depending on your health history and family history, you may need to have cancer screening at various ages. This may include screening for: Breast cancer. Cervical cancer. Colorectal cancer. Skin cancer. Lung cancer. What should I know about heart disease, diabetes, and high blood pressure? Blood pressure and heart disease High blood pressure causes heart disease and increases the risk of stroke. This is more likely to develop in people who have high blood pressure readings or are overweight. Have your blood pressure checked: Every 3-5 years if you are 16-82 years of age. Every year if you are 48 years old or older. Diabetes Have regular diabetes screenings. This checks your fasting blood sugar level. Have the screening done: Once  every three years after age 37 if you are at a normal weight and have a low risk for diabetes. More often and at a younger age if you are overweight or have a high risk for diabetes. What should I know about preventing infection? Hepatitis B If you have a higher risk for hepatitis B, you should be screened for this virus. Talk with your health care provider to find out if you are at risk for hepatitis B infection. Hepatitis C Testing is recommended for: Everyone born from [birth year] through 1966. Anyone with known risk factors for hepatitis C. Sexually transmitted infections (STIs) Get screened for STIs, including gonorrhea and chlamydia, if: You are sexually active and are younger than 69 years of age. You are older than 69 years of age and your health care provider tells you that you are at risk for this type of infection. Your sexual activity has changed since you were last screened, and you are at increased risk for chlamydia or gonorrhea. Ask your health care provider if you are at risk. Ask your health care provider about whether you are at high risk for HIV. Your health care provider may recommend a prescription medicine to help prevent HIV infection. If you choose to take medicine to prevent HIV, you should first get tested for HIV. You should then be tested every 3 months for as long as you are taking the medicine.  Pregnancy If you are about to stop having your period (premenopausal) and you may become pregnant, seek counseling before you get pregnant. Take 400 to 800 micrograms (mcg) of folic acid every day if you become pregnant. Ask for birth control (contraception) if you want to prevent pregnancy. Osteoporosis and menopause Osteoporosis is a disease in which the bones lose minerals and strength with aging. This can result in bone fractures. If you are 56 years old or older, or if you are at risk for osteoporosis and fractures, ask your health care provider if you should: Be screened for  bone loss. Take a calcium or vitamin D  supplement to lower your risk of fractures. Be given hormone replacement therapy (HRT) to treat symptoms of menopause. Follow these instructions at home: Alcohol use Do not drink alcohol if: Your health care provider tells you not to drink. You are pregnant, may be pregnant, or are planning to become pregnant. If you drink alcohol: Limit how much you have to: 0-1 drink a day. Know how much alcohol is in your drink. In the [place] one drink equals one 12 oz bottle of beer (355 mL), one 5 oz glass of wine (148 mL), or one 1 oz glass of hard liquor (44 mL). Lifestyle Do not use any products that contain nicotine or tobacco. These products include cigarettes, chewing tobacco, and vaping devices, such as e-cigarettes. If you need help quitting, ask your health care provider. Do not use street drugs. Do not share needles. Ask your health care provider for help if you need support or information about quitting drugs. General instructions Schedule regular health, dental, and eye exams. Stay current with your vaccines. Tell your health care provider if: You often feel depressed. You have ever been abused or do not feel safe at home. Summary Adopting a healthy lifestyle and getting preventive care are important in promoting health and wellness. Follow your health care provider's instructions about healthy diet, exercising, and getting tested or screened for diseases. Follow your health care provider's instructions on monitoring your cholesterol and blood pressure. This information is not intended to replace advice given to you by your health care provider. Make sure you discuss any questions you have with your health care provider. Document Revised: 05/03/2021 Document Reviewed: 05/03/2021 Elsevier Patient Education  2024 Elsevier Inc.      Emil Schaumann, MD [facility]

## 2024-01-09 NOTE — Assessment & Plan Note (Signed)
 Cough management discussed Recommend to continue over-the-counter Mucinex DM and cough drops Advised to rest and stay well-hydrated Viral infection running its course

## 2024-01-12 ENCOUNTER — Encounter: Payer: Self-pay | Admitting: Emergency Medicine

## 2024-01-15 NOTE — Telephone Encounter (Signed)
Prescriptions will be renewed. Recommend Shingrix vaccine Transderm Scop is the patch I recommend.  We can send a prescription when she needs it. Thanks.

## 2024-01-19 ENCOUNTER — Other Ambulatory Visit: Payer: Self-pay | Admitting: Emergency Medicine

## 2024-01-19 DIAGNOSIS — F4322 Adjustment disorder with anxiety: Secondary | ICD-10-CM

## 2024-01-19 DIAGNOSIS — E038 Other specified hypothyroidism: Secondary | ICD-10-CM

## 2024-01-19 NOTE — Telephone Encounter (Signed)
Copied from CRM [id number]. Topic: Clinical - Medication Refill >> Jan 19, 2024 10:18 AM Pascal Lux wrote: Most Recent Primary Care Visit:  Provider: Georgina Quint  Department: [facility]  Visit Type: OFFICE VISIT  Date: 01/09/2024  Medication: levothyroxine (SYNTHROID) 25 MCG tablet [[id number]] - citalopram (CELEXA) 10 MG tablet [[id number]]  Has the patient contacted their pharmacy? No (Agent: If no, request that the patient contact the pharmacy for the refill. If patient does not wish to contact the pharmacy document the reason why and proceed with request.) (Agent: If yes, when and what did the pharmacy advise?)  Is this the correct pharmacy for this prescription? Yes If no, delete pharmacy and type the correct one.  This is the patient's preferred pharmacy:  [facility] - [place], [place] - [address] [place] [address] Phone: [phone] Fax: [phone]   Has the prescription been filled recently? Yes  Is the patient out of the medication? No, soon  Has the patient been seen for an appointment in the last year OR does the patient have an upcoming appointment? Yes  Can we respond through MyChart? Yes  Agent: Please be advised that Rx refills may take up to 3 business days. We ask that you follow-up with your pharmacy.

## 2024-01-22 ENCOUNTER — Encounter: Payer: Medicare Other | Admitting: Emergency Medicine

## 2024-01-22 ENCOUNTER — Other Ambulatory Visit: Payer: Self-pay | Admitting: Radiology

## 2024-01-22 ENCOUNTER — Other Ambulatory Visit: Payer: Self-pay | Admitting: Emergency Medicine

## 2024-01-22 DIAGNOSIS — E038 Other specified hypothyroidism: Secondary | ICD-10-CM

## 2024-01-22 DIAGNOSIS — F4322 Adjustment disorder with anxiety: Secondary | ICD-10-CM

## 2024-01-22 MED ORDER — LEVOTHYROXINE SODIUM 25 MCG PO TABS: 25.0000 ug | ORAL_TABLET | Freq: Every day | ORAL | 3 refills | Status: DC

## 2024-01-22 MED ORDER — CITALOPRAM HYDROBROMIDE 10 MG PO TABS: 10.0000 mg | ORAL_TABLET | Freq: Every day | ORAL | 3 refills | Status: DC

## 2024-01-22 NOTE — Telephone Encounter (Signed)
New prescription for citalopram 10 mg sent to pharmacy requested today.  Thanks.

## 2024-03-05 ENCOUNTER — Other Ambulatory Visit: Payer: Self-pay | Admitting: Emergency Medicine

## 2024-03-05 ENCOUNTER — Encounter: Payer: Self-pay | Admitting: Emergency Medicine

## 2024-03-05 MED ORDER — SCOPOLAMINE 1 MG/3DAYS TD PT72: 1.0000 | MEDICATED_PATCH | TRANSDERMAL | 5 refills | Status: DC

## 2025-01-13 ENCOUNTER — Encounter: Payer: Self-pay | Admitting: Emergency Medicine

## 2025-01-13 ENCOUNTER — Ambulatory Visit: Payer: Medicare Other | Admitting: Emergency Medicine

## 2025-01-13 VITALS — BP 126/82 | HR 90 | Temp 97.9°F | Ht 65.0 in | Wt 105.0 lb

## 2025-01-13 DIAGNOSIS — Z13228 Encounter for screening for other metabolic disorders: Secondary | ICD-10-CM | POA: Diagnosis not present

## 2025-01-13 DIAGNOSIS — E038 Other specified hypothyroidism: Secondary | ICD-10-CM

## 2025-01-13 DIAGNOSIS — F4322 Adjustment disorder with anxiety: Secondary | ICD-10-CM | POA: Diagnosis not present

## 2025-01-13 DIAGNOSIS — Z1329 Encounter for screening for other suspected endocrine disorder: Secondary | ICD-10-CM

## 2025-01-13 DIAGNOSIS — L659 Nonscarring hair loss, unspecified: Secondary | ICD-10-CM | POA: Insufficient documentation

## 2025-01-13 DIAGNOSIS — Z0000 Encounter for general adult medical examination without abnormal findings: Secondary | ICD-10-CM

## 2025-01-13 DIAGNOSIS — Z0184 Encounter for antibody response examination: Secondary | ICD-10-CM

## 2025-01-13 DIAGNOSIS — Z0001 Encounter for general adult medical examination with abnormal findings: Secondary | ICD-10-CM

## 2025-01-13 DIAGNOSIS — Z130 Encounter for screening for diseases of the blood and blood-forming organs and certain disorders involving the immune mechanism: Secondary | ICD-10-CM

## 2025-01-13 DIAGNOSIS — Z13220 Encounter for screening for lipoid disorders: Secondary | ICD-10-CM

## 2025-01-13 LAB — CBC WITH DIFFERENTIAL/PLATELET
Basophils Absolute: 0 K/uL (ref 0.0–0.1)
Basophils Relative: 1.1 % (ref 0.0–3.0)
Eosinophils Absolute: 0.1 K/uL (ref 0.0–0.7)
Eosinophils Relative: 1.4 % (ref 0.0–5.0)
HCT: 43 % (ref 36.0–46.0)
Hemoglobin: 14.6 g/dL (ref 12.0–15.0)
Lymphocytes Relative: 25.9 % (ref 12.0–46.0)
Lymphs Abs: 1.1 K/uL (ref 0.7–4.0)
MCHC: 34 g/dL (ref 30.0–36.0)
MCV: 88.9 fl (ref 78.0–100.0)
Monocytes Absolute: 0.3 K/uL (ref 0.1–1.0)
Monocytes Relative: 7.8 % (ref 3.0–12.0)
Neutro Abs: 2.7 K/uL (ref 1.4–7.7)
Neutrophils Relative %: 63.8 % (ref 43.0–77.0)
Platelets: 277 K/uL (ref 150.0–400.0)
RBC: 4.84 Mil/uL (ref 3.87–5.11)
RDW: 13.3 % (ref 11.5–15.5)
WBC: 4.2 K/uL (ref 4.0–10.5)

## 2025-01-13 LAB — VITAMIN B12: Vitamin B-12: 696 pg/mL (ref 211–911)

## 2025-01-13 LAB — COMPREHENSIVE METABOLIC PANEL WITH GFR
ALT: 23 U/L (ref 3–35)
AST: 33 U/L (ref 5–37)
Albumin: 4.5 g/dL (ref 3.5–5.2)
Alkaline Phosphatase: 129 U/L — ABNORMAL HIGH (ref 39–117)
BUN: 15 mg/dL (ref 6–23)
CO2: 29 meq/L (ref 19–32)
Calcium: 9.6 mg/dL (ref 8.4–10.5)
Chloride: 101 meq/L (ref 96–112)
Creatinine, Ser: 0.53 mg/dL (ref 0.40–1.20)
GFR: 94.45 mL/min
Glucose, Bld: 85 mg/dL (ref 70–99)
Potassium: 4.1 meq/L (ref 3.5–5.1)
Sodium: 137 meq/L (ref 135–145)
Total Bilirubin: 1.2 mg/dL (ref 0.2–1.2)
Total Protein: 7.9 g/dL (ref 6.0–8.3)

## 2025-01-13 LAB — LIPID PANEL
Cholesterol: 208 mg/dL — ABNORMAL HIGH (ref 28–200)
HDL: 78.7 mg/dL
LDL Cholesterol: 117 mg/dL — ABNORMAL HIGH (ref 10–99)
NonHDL: 129.75
Total CHOL/HDL Ratio: 3
Triglycerides: 66 mg/dL (ref 10.0–149.0)
VLDL: 13.2 mg/dL (ref 0.0–40.0)

## 2025-01-13 LAB — HEMOGLOBIN A1C: Hgb A1c MFr Bld: 5.2 % (ref 4.6–6.5)

## 2025-01-13 LAB — VITAMIN D 25 HYDROXY (VIT D DEFICIENCY, FRACTURES): VITD: 37.64 ng/mL (ref 30.00–100.00)

## 2025-01-13 MED ORDER — SCOPOLAMINE 1 MG/3DAYS TD PT72: 1.0000 | MEDICATED_PATCH | TRANSDERMAL | 5 refills | Status: AC

## 2025-01-13 MED ORDER — LEVOTHYROXINE SODIUM 25 MCG PO TABS: 25.0000 ug | ORAL_TABLET | Freq: Every day | ORAL | 3 refills | Status: AC

## 2025-01-13 NOTE — Assessment & Plan Note (Signed)
 Clinically stable. Continues Celexa 10 mg daily

## 2025-01-13 NOTE — Assessment & Plan Note (Signed)
Clinically euthyroid.  TSH done today.  Continue Synthroid 25 mcg daily. ?

## 2025-01-13 NOTE — Assessment & Plan Note (Signed)
 Multifactorial Labs today including vitamin levels May be related to thyroid  condition as well as aging process

## 2025-01-13 NOTE — Progress Notes (Signed)
 Renee Watson 69 y.o.   Chief Complaint  Patient presents with   Annual Exam    HISTORY OF PRESENT ILLNESS: This is a 69 y.o. female here for annual exam today. Has several concerns: 1.  Unknown immunity status to measles 2.  Hair loss 3.  Thyroid  condition.  Once thyroid  panel 4.  Questions about Celexa  dose Scheduled for mammogram in a couple of months No other complaints or medical concerns today.  HPI   Prior to Admission medications  Medication Sig Start Date End Date Taking? Authorizing Provider  citalopram  (CELEXA ) 10 MG tablet Take 1 tablet (10 mg total) by mouth daily. 01/22/24  Yes Isra Lindy, Emil Schanz, MD  levothyroxine  (SYNTHROID ) 25 MCG tablet Take 1 tablet (25 mcg total) by mouth daily. 01/22/24  Yes Nicholson Starace, Emil Schanz, MD  scopolamine  (TRANSDERM SCOP , 1.5 MG,) 1 MG/3DAYS Place 1 patch (1.5 mg total) onto the skin every 3 (three) days. 03/05/24  Yes Tiffiny Worthy Jose, MD  XDEMVY 0.25 % SOLN Apply 1 drop to eye 2 (two) times daily. 03/07/23  Yes [provider]    Allergies[1]  Patient Active Problem List   Diagnosis Date Noted   Adjustment disorder with anxious mood 01/18/2019   Elevated alkaline phosphatase level 03/03/2015   Underweight 01/10/2015   Other specified hypothyroidism 01/02/2014    Past Medical History:  Diagnosis Date   Hyperbilirubinemia    Thyroid  disease     Past Surgical History:  Procedure Laterality Date   NO PAST SURGERIES      Social History   Socioeconomic History   Marital status: Divorced    Spouse name: Not on file   Number of children: Not on file   Years of education: Not on file   Highest education level: Bachelor's degree (e.g., BA, AB, BS)  Occupational History   Not on file  Tobacco Use   Smoking status: Never   Smokeless tobacco: Never  Vaping Use   Vaping status: Never Used  Substance and Sexual Activity   Alcohol use: Not Currently   Drug use: No   Sexual activity: Not Currently   Other Topics Concern   Not on file  Social History Narrative   Not on file   Social Drivers of Health   Tobacco Use: Low Risk (01/13/2025)   Patient History    Smoking Tobacco Use: Never    Smokeless Tobacco Use: Never    Passive Exposure: Not on file  Financial Resource Strain: Medium Risk (01/12/2025)   Overall Financial Resource Strain (CARDIA)    Difficulty of Paying Living Expenses: Somewhat hard  Food Insecurity: No Food Insecurity (01/12/2025)   Epic    Worried About Programme Researcher, Broadcasting/film/video in the Last Year: Never true    Ran Out of Food in the Last Year: Never true  Transportation Needs: No Transportation Needs (01/12/2025)   Epic    Lack of Transportation (Medical): No    Lack of Transportation (Non-Medical): No  Physical Activity: Insufficiently Active (01/12/2025)   Exercise Vital Sign    Days of Exercise per Week: 7 days    Minutes of Exercise per Session: 20 min  Stress: Stress Concern Present (01/12/2025)   [employer] of Occupational Health - Occupational Stress Questionnaire    Feeling of Stress: To some extent  Social Connections: Moderately Integrated (01/12/2025)   Social Connection and Isolation Panel    Frequency of Communication with Friends and Family: More than three times a week    Frequency of  Social Gatherings with Friends and Family: Three times a week    Attends Religious Services: 1 to 4 times per year    Active Member of Clubs or Organizations: Yes    Attends Banker Meetings: More than 4 times per year    Marital Status: Divorced  Intimate Partner Violence: Not on file  Depression (PHQ2-9): Low Risk (01/13/2025)   Depression (PHQ2-9)    PHQ-2 Score: 3  Alcohol Screen: Not on file  Housing: Low Risk (01/12/2025)   Epic    Unable to Pay for Housing in the Last Year: No    Number of Times Moved in the Last Year: 0    Homeless in the Last Year: No  Utilities: Not on file  Health Literacy: Not on file    Family History  Problem  Relation Age of Onset   Hyperlipidemia Mother    Diabetes Mother    Liver disease Father    Alcoholism Father    Colon cancer Neg Hx    Colon polyps Neg Hx    Esophageal cancer Neg Hx    Pancreatic cancer Neg Hx    Stomach cancer Neg Hx    Rectal cancer Neg Hx      Review of Systems  Constitutional: Negative.  Negative for chills and fever.  HENT: Negative.  Negative for congestion and sore throat.   Respiratory: Negative.  Negative for cough and shortness of breath.   Cardiovascular: Negative.  Negative for chest pain and palpitations.  Gastrointestinal:  Negative for abdominal pain, diarrhea, nausea and vomiting.  Genitourinary: Negative.  Negative for dysuria and hematuria.  Skin: Negative.  Negative for rash.  Neurological: Negative.  Negative for dizziness and headaches.  All other systems reviewed and are negative.   Vitals:   01/13/25 0851  BP: 126/82  Pulse: 90  Temp: 97.9 F (36.6 C)  SpO2: 96%    Physical Exam Vitals reviewed.  Constitutional:      Appearance: Normal appearance.  HENT:     Head: Normocephalic.     Right Ear: Tympanic membrane, ear canal and external ear normal.     Left Ear: Tympanic membrane, ear canal and external ear normal.     Mouth/Throat:     Mouth: Mucous membranes are moist.     Pharynx: Oropharynx is clear.  Eyes:     Extraocular Movements: Extraocular movements intact.     Conjunctiva/sclera: Conjunctivae normal.     Pupils: Pupils are equal, round, and reactive to light.  Cardiovascular:     Rate and Rhythm: Normal rate and regular rhythm.     Pulses: Normal pulses.     Heart sounds: Normal heart sounds.  Pulmonary:     Effort: Pulmonary effort is normal.     Breath sounds: Normal breath sounds.  Abdominal:     Palpations: Abdomen is soft.     Tenderness: There is no abdominal tenderness.  Musculoskeletal:     Cervical back: No tenderness.  Lymphadenopathy:     Cervical: No cervical adenopathy.  Skin:    General:  Skin is warm and dry.     Capillary Refill: Capillary refill takes less than 2 seconds.  Neurological:     General: No focal deficit present.     Mental Status: She is alert and oriented to person, place, and time.  Psychiatric:        Mood and Affect: Mood normal.        Behavior: Behavior normal.  ASSESSMENT & PLAN: Problem List Items Addressed This Visit       Endocrine   Other specified hypothyroidism   Clinically euthyroid.  TSH done today. Continue Synthroid  25 mcg daily      Relevant Medications   levothyroxine  (SYNTHROID ) 25 MCG tablet   Other Relevant Orders   Thyroid  Panel With TSH     Other   Adjustment disorder with anxious mood   Clinically stable. Continues Celexa  10 mg daily      Hair loss   Multifactorial Labs today including vitamin levels May be related to thyroid  condition as well as aging process      Relevant Orders   Thyroid  Panel With TSH   VITAMIN D  25 Hydroxy (Vit-D Deficiency, Fractures)   Vitamin B12   Other Visit Diagnoses       Encounter for general adult medical examination with abnormal findings    -  Primary   Relevant Orders   VITAMIN D  25 Hydroxy (Vit-D Deficiency, Fractures)   Vitamin B12   Lipid panel   Hemoglobin A1c   Comprehensive metabolic panel with GFR   CBC with Differential/Platelet     Immunity status testing       Relevant Orders   Measles/Mumps/Rubella Immunity     Screening for deficiency anemia       Relevant Orders   CBC with Differential/Platelet     Screening for lipoid disorders       Relevant Orders   Lipid panel     Screening for endocrine, metabolic and immunity disorder       Relevant Orders   VITAMIN D  25 Hydroxy (Vit-D Deficiency, Fractures)   Vitamin B12   Hemoglobin A1c   Comprehensive metabolic panel with GFR      Modifiable risk factors discussed with patient. Anticipatory guidance according to age provided. The following topics were also discussed: Social Determinants of  Health Smoking.  Non-smoker Diet and nutrition Benefits of exercise Cancer screening and need for breast cancer screening with mammogram Vaccinations review and recommendations Cardiovascular risk assessment and need for blood work Mental health including depression and anxiety Fall and accident prevention  Patient Instructions  Health Maintenance, Female Adopting a healthy lifestyle and getting preventive care are important in promoting health and wellness. Ask your health care provider about: The right schedule for you to have regular tests and exams. Things you can do on your own to prevent diseases and keep yourself healthy. What should I know about diet, weight, and exercise? Eat a healthy diet  Eat a diet that includes plenty of vegetables, fruits, low-fat dairy products, and lean protein. Do not eat a lot of foods that are high in solid fats, added sugars, or sodium. Maintain a healthy weight Body mass index (BMI) is used to identify weight problems. It estimates body fat based on height and weight. Your health care provider can help determine your BMI and help you achieve or maintain a healthy weight. Get regular exercise Get regular exercise. This is one of the most important things you can do for your health. Most adults should: Exercise for at least 150 minutes each week. The exercise should increase your heart rate and make you sweat (moderate-intensity exercise). Do strengthening exercises at least twice a week. This is in addition to the moderate-intensity exercise. Spend less time sitting. Even light physical activity can be beneficial. Watch cholesterol and blood lipids Have your blood tested for lipids and cholesterol at 70 years of age, then have this test  every 5 years. Have your cholesterol levels checked more often if: Your lipid or cholesterol levels are high. You are older than 70 years of age. You are at high risk for heart disease. What should I know about  cancer screening? Depending on your health history and family history, you may need to have cancer screening at various ages. This may include screening for: Breast cancer. Cervical cancer. Colorectal cancer. Skin cancer. Lung cancer. What should I know about heart disease, diabetes, and high blood pressure? Blood pressure and heart disease High blood pressure causes heart disease and increases the risk of stroke. This is more likely to develop in people who have high blood pressure readings or are overweight. Have your blood pressure checked: Every 3-5 years if you are 25-58 years of age. Every year if you are 20 years old or older. Diabetes Have regular diabetes screenings. This checks your fasting blood sugar level. Have the screening done: Once every three years after age 42 if you are at a normal weight and have a low risk for diabetes. More often and at a younger age if you are overweight or have a high risk for diabetes. What should I know about preventing infection? Hepatitis B If you have a higher risk for hepatitis B, you should be screened for this virus. Talk with your health care provider to find out if you are at risk for hepatitis B infection. Hepatitis C Testing is recommended for: Everyone born from [birth year] through 1966. Anyone with known risk factors for hepatitis C. Sexually transmitted infections (STIs) Get screened for STIs, including gonorrhea and chlamydia, if: You are sexually active and are younger than 70 years of age. You are older than 70 years of age and your health care provider tells you that you are at risk for this type of infection. Your sexual activity has changed since you were last screened, and you are at increased risk for chlamydia or gonorrhea. Ask your health care provider if you are at risk. Ask your health care provider about whether you are at high risk for HIV. Your health care provider may recommend a prescription medicine to help prevent HIV  infection. If you choose to take medicine to prevent HIV, you should first get tested for HIV. You should then be tested every 3 months for as long as you are taking the medicine. Pregnancy If you are about to stop having your period (premenopausal) and you may become pregnant, seek counseling before you get pregnant. Take 400 to 800 micrograms (mcg) of folic acid every day if you become pregnant. Ask for birth control (contraception) if you want to prevent pregnancy. Osteoporosis and menopause Osteoporosis is a disease in which the bones lose minerals and strength with aging. This can result in bone fractures. If you are 33 years old or older, or if you are at risk for osteoporosis and fractures, ask your health care provider if you should: Be screened for bone loss. Take a calcium or vitamin D  supplement to lower your risk of fractures. Be given hormone replacement therapy (HRT) to treat symptoms of menopause. Follow these instructions at home: Alcohol use Do not drink alcohol if: Your health care provider tells you not to drink. You are pregnant, may be pregnant, or are planning to become pregnant. If you drink alcohol: Limit how much you have to: 0-1 drink a day. Know how much alcohol is in your drink. In the [place] one drink equals one 12 oz bottle of beer (355  mL), one 5 oz glass of wine (148 mL), or one 1 oz glass of hard liquor (44 mL). Lifestyle Do not use any products that contain nicotine or tobacco. These products include cigarettes, chewing tobacco, and vaping devices, such as e-cigarettes. If you need help quitting, ask your health care provider. Do not use street drugs. Do not share needles. Ask your health care provider for help if you need support or information about quitting drugs. General instructions Schedule regular health, dental, and eye exams. Stay current with your vaccines. Tell your health care provider if: You often feel depressed. You have ever been abused  or do not feel safe at home. Summary Adopting a healthy lifestyle and getting preventive care are important in promoting health and wellness. Follow your health care provider's instructions about healthy diet, exercising, and getting tested or screened for diseases. Follow your health care provider's instructions on monitoring your cholesterol and blood pressure. This information is not intended to replace advice given to you by your health care provider. Make sure you discuss any questions you have with your health care provider. Document Revised: 05/03/2021 Document Reviewed: 05/03/2021 Elsevier Patient Education  2024 Elsevier Inc.      Emil Schaumann, MD [facility]    [1] No Known Allergies

## 2025-01-13 NOTE — Patient Instructions (Signed)

## 2025-01-14 LAB — MEASLES/MUMPS/RUBELLA IMMUNITY
Mumps IgG: 247 [AU]/ml
Rubella: 6.84 {index}
Rubeola IgG: 61.9 [AU]/ml

## 2025-01-14 LAB — THYROID PANEL WITH TSH
Free Thyroxine Index: 3.5 (ref 1.4–3.8)
T3 Uptake: 32 % (ref 22–35)
T4, Total: 10.9 ug/dL (ref 5.1–11.9)
TSH: 2.79 m[IU]/L (ref 0.40–4.50)

## 2025-01-15 ENCOUNTER — Ambulatory Visit: Payer: Self-pay | Admitting: Emergency Medicine

## 2025-01-17 ENCOUNTER — Other Ambulatory Visit: Payer: Self-pay | Admitting: Emergency Medicine

## 2025-01-17 DIAGNOSIS — F4322 Adjustment disorder with anxiety: Secondary | ICD-10-CM

## 2025-01-17 MED ORDER — CITALOPRAM HYDROBROMIDE 10 MG PO TABS: 10.0000 mg | ORAL_TABLET | Freq: Every day | ORAL | 3 refills | Status: AC

## 2025-01-17 NOTE — Telephone Encounter (Signed)
 New prescription for citalopram  sent to pharmacy of record today.  Yes, she is due for Prevnar 20 vaccination.

## 2025-01-17 NOTE — Telephone Encounter (Signed)
 Is patient due for a prenar 20 vaccine? Can you send in refill for citalopram  and motion sickness

## 2025-03-04 ENCOUNTER — Ambulatory Visit

## 2026-01-14 ENCOUNTER — Encounter: Admitting: Emergency Medicine
